# Patient Record
Sex: Female | Born: 1960
Health system: Southern US, Community
[De-identification: ages and names within clinical notes are randomized; demographics above are authoritative.]

## PROBLEM LIST (undated history)

## (undated) DIAGNOSIS — K759 Inflammatory liver disease, unspecified: Secondary | ICD-10-CM

## (undated) DIAGNOSIS — G43909 Migraine, unspecified, not intractable, without status migrainosus: Secondary | ICD-10-CM

## (undated) DIAGNOSIS — J309 Allergic rhinitis, unspecified: Secondary | ICD-10-CM

## (undated) DIAGNOSIS — M1990 Unspecified osteoarthritis, unspecified site: Secondary | ICD-10-CM

## (undated) DIAGNOSIS — N83209 Unspecified ovarian cyst, unspecified side: Secondary | ICD-10-CM

## (undated) DIAGNOSIS — D72819 Decreased white blood cell count, unspecified: Secondary | ICD-10-CM

## (undated) HISTORY — PX: KNEE ARTHROSCOPY: SHX127

## (undated) HISTORY — DX: Decreased white blood cell count, unspecified: D72.819

## (undated) HISTORY — DX: Allergic rhinitis, unspecified: J30.9

## (undated) HISTORY — PX: COLONOSCOPY: SHX174

## (undated) HISTORY — PX: HAMMER TOE SURGERY: SHX385

## (undated) HISTORY — DX: Migraine, unspecified, not intractable, without status migrainosus: G43.909

## (undated) HISTORY — PX: TONSILLECTOMY: SUR1361

---

## 1998-06-03 ENCOUNTER — Other Ambulatory Visit: Admission: RE | Admit: 1998-06-03 | Discharge: 1998-06-03 | Payer: Self-pay

## 1998-12-03 ENCOUNTER — Other Ambulatory Visit: Admission: RE | Admit: 1998-12-03 | Discharge: 1998-12-03 | Payer: Self-pay | Admitting: *Deleted

## 1999-06-07 ENCOUNTER — Other Ambulatory Visit: Admission: RE | Admit: 1999-06-07 | Discharge: 1999-06-07 | Payer: Self-pay | Admitting: Obstetrics and Gynecology

## 2000-02-06 ENCOUNTER — Ambulatory Visit (HOSPITAL_COMMUNITY): Admission: RE | Admit: 2000-02-06 | Discharge: 2000-02-06 | Payer: Self-pay | Admitting: Obstetrics and Gynecology

## 2000-02-06 ENCOUNTER — Encounter: Payer: Self-pay | Admitting: Obstetrics and Gynecology

## 2000-04-28 ENCOUNTER — Inpatient Hospital Stay (HOSPITAL_COMMUNITY): Admission: RE | Admit: 2000-04-28 | Discharge: 2000-04-28 | Payer: Self-pay | Admitting: Obstetrics and Gynecology

## 2000-10-12 ENCOUNTER — Other Ambulatory Visit: Admission: RE | Admit: 2000-10-12 | Discharge: 2000-10-12 | Payer: Self-pay | Admitting: Internal Medicine

## 2001-08-23 ENCOUNTER — Other Ambulatory Visit: Admission: RE | Admit: 2001-08-23 | Discharge: 2001-08-23 | Payer: Self-pay | Admitting: Internal Medicine

## 2002-06-16 ENCOUNTER — Other Ambulatory Visit: Admission: RE | Admit: 2002-06-16 | Discharge: 2002-06-16 | Payer: Self-pay | Admitting: Obstetrics and Gynecology

## 2003-02-17 ENCOUNTER — Encounter: Payer: Self-pay | Admitting: Internal Medicine

## 2003-06-17 ENCOUNTER — Other Ambulatory Visit: Admission: RE | Admit: 2003-06-17 | Discharge: 2003-06-17 | Payer: Self-pay | Admitting: Obstetrics and Gynecology

## 2004-07-05 ENCOUNTER — Other Ambulatory Visit: Admission: RE | Admit: 2004-07-05 | Discharge: 2004-07-05 | Payer: Self-pay | Admitting: Obstetrics and Gynecology

## 2005-07-12 ENCOUNTER — Other Ambulatory Visit: Admission: RE | Admit: 2005-07-12 | Discharge: 2005-07-12 | Payer: Self-pay | Admitting: Obstetrics and Gynecology

## 2005-12-18 ENCOUNTER — Encounter: Payer: Self-pay | Admitting: Internal Medicine

## 2006-05-11 ENCOUNTER — Ambulatory Visit: Payer: Self-pay | Admitting: Internal Medicine

## 2006-05-30 ENCOUNTER — Ambulatory Visit: Payer: Self-pay | Admitting: Internal Medicine

## 2006-07-03 ENCOUNTER — Ambulatory Visit: Payer: Self-pay | Admitting: Internal Medicine

## 2006-07-03 LAB — CONVERTED CEMR LAB
ALT: 279 units/L — ABNORMAL HIGH (ref 0–40)
AST: 147 units/L — ABNORMAL HIGH (ref 0–37)
Albumin: 3.8 g/dL (ref 3.5–5.2)
Alkaline Phosphatase: 68 units/L (ref 39–117)
BUN: 5 mg/dL — ABNORMAL LOW (ref 6–23)
Basophils Absolute: 0 10*3/uL (ref 0.0–0.1)
Basophils Relative: 0.3 % (ref 0.0–1.0)
Bilirubin, Direct: 0.1 mg/dL (ref 0.0–0.3)
CO2: 30 meq/L (ref 19–32)
Calcium: 9.2 mg/dL (ref 8.4–10.5)
Chloride: 106 meq/L (ref 96–112)
Cholesterol: 161 mg/dL (ref 0–200)
Creatinine, Ser: 0.8 mg/dL (ref 0.4–1.2)
Eosinophils Absolute: 0.1 10*3/uL (ref 0.0–0.6)
Eosinophils Relative: 2.3 % (ref 0.0–5.0)
GFR calc Af Amer: 98 mL/min
GFR calc non Af Amer: 81 mL/min
Glucose, Bld: 93 mg/dL (ref 70–99)
HCT: 37 % (ref 36.0–46.0)
HDL: 63.6 mg/dL (ref >39.0)
Hemoglobin: 12.5 g/dL (ref 12.0–15.0)
LDL Cholesterol: 89 mg/dL (ref 0–99)
Lymphocytes Relative: 31.7 % (ref 12.0–46.0)
MCHC: 33.8 g/dL (ref 30.0–36.0)
MCV: 80.8 fL (ref 78.0–100.0)
Monocytes Absolute: 0.4 10*3/uL (ref 0.2–0.7)
Monocytes Relative: 11.7 % — ABNORMAL HIGH (ref 3.0–11.0)
Neutro Abs: 1.9 10*3/uL (ref 1.4–7.7)
Neutrophils Relative %: 54 % (ref 43.0–77.0)
Platelets: 188 10*3/uL (ref 150–400)
Potassium: 3.8 meq/L (ref 3.5–5.1)
RBC: 4.58 M/uL (ref 3.87–5.11)
RDW: 13.6 % (ref 11.5–14.6)
Sodium: 140 meq/L (ref 135–145)
TSH: 0.88 microintl units/mL (ref 0.35–5.50)
Total Bilirubin: 1.2 mg/dL (ref 0.3–1.2)
Total CHOL/HDL Ratio: 2.5
Total Protein: 6.5 g/dL (ref 6.0–8.3)
Triglycerides: 40 mg/dL (ref 0–149)
VLDL: 8 mg/dL (ref 0–40)
WBC: 3.5 10*3/uL — ABNORMAL LOW (ref 4.5–10.5)

## 2006-07-10 ENCOUNTER — Ambulatory Visit: Payer: Self-pay | Admitting: Internal Medicine

## 2006-07-10 LAB — CONVERTED CEMR LAB
ALT: 220 units/L — ABNORMAL HIGH (ref 0–40)
AST: 120 units/L — ABNORMAL HIGH (ref 0–37)
Albumin: 4.2 g/dL (ref 3.5–5.2)
Alkaline Phosphatase: 73 units/L (ref 39–117)
Anti Nuclear Antibody(ANA): NEGATIVE
Bilirubin, Direct: 0.2 mg/dL (ref 0.0–0.3)
Ceruloplasmin: 52 mg/dL (ref 21–63)
Ferritin: 105.9 ng/mL (ref 10.0–291.0)
HCV Ab: NEGATIVE
Hep A IgM: NEGATIVE
Hep B C IgM: NEGATIVE
Hepatitis B Surface Ag: NEGATIVE
Total Bilirubin: 1.1 mg/dL (ref 0.3–1.2)
Total Protein: 7 g/dL (ref 6.0–8.3)

## 2006-07-17 ENCOUNTER — Encounter: Admission: RE | Admit: 2006-07-17 | Discharge: 2006-07-17 | Payer: Self-pay | Admitting: Internal Medicine

## 2006-08-16 ENCOUNTER — Ambulatory Visit: Payer: Self-pay | Admitting: Internal Medicine

## 2006-08-16 LAB — CONVERTED CEMR LAB
Cholesterol: 197 mg/dL (ref 0–200)
HDL: 65 mg/dL (ref >39.0)
LDL Cholesterol: 127 mg/dL — ABNORMAL HIGH (ref 0–99)
Total CHOL/HDL Ratio: 3
Triglycerides: 27 mg/dL (ref 0–149)
VLDL: 5 mg/dL (ref 0–40)

## 2006-09-03 ENCOUNTER — Ambulatory Visit: Payer: Self-pay | Admitting: Internal Medicine

## 2006-09-03 LAB — CONVERTED CEMR LAB
ALT: 33 units/L (ref 0–35)
AST: 32 units/L (ref 0–37)
Albumin: 3.9 g/dL (ref 3.5–5.2)
Alkaline Phosphatase: 67 units/L (ref 39–117)
Bilirubin, Direct: 0.1 mg/dL (ref 0.0–0.3)
Total Bilirubin: 0.8 mg/dL (ref 0.3–1.2)
Total Protein: 6.8 g/dL (ref 6.0–8.3)

## 2006-09-11 ENCOUNTER — Ambulatory Visit: Payer: Self-pay | Admitting: Internal Medicine

## 2006-09-21 ENCOUNTER — Ambulatory Visit: Payer: Self-pay | Admitting: Internal Medicine

## 2006-09-21 LAB — HM COLONOSCOPY: HM Colonoscopy: NORMAL

## 2006-12-18 ENCOUNTER — Encounter: Payer: Self-pay | Admitting: Internal Medicine

## 2008-01-27 ENCOUNTER — Encounter: Payer: Self-pay | Admitting: Internal Medicine

## 2008-09-14 ENCOUNTER — Ambulatory Visit: Payer: Self-pay | Admitting: Internal Medicine

## 2008-09-14 DIAGNOSIS — R519 Headache, unspecified: Secondary | ICD-10-CM | POA: Insufficient documentation

## 2008-09-14 DIAGNOSIS — N951 Menopausal and female climacteric states: Secondary | ICD-10-CM | POA: Insufficient documentation

## 2008-09-14 DIAGNOSIS — R51 Headache: Secondary | ICD-10-CM | POA: Insufficient documentation

## 2008-09-21 ENCOUNTER — Encounter: Payer: Self-pay | Admitting: *Deleted

## 2008-09-21 LAB — CONVERTED CEMR LAB
BUN: 12 mg/dL (ref 6–23)
Basophils Absolute: 0 10*3/uL (ref 0.0–0.1)
Basophils Relative: 0.7 % (ref 0.0–3.0)
CO2: 32 meq/L (ref 19–32)
Calcium: 9.4 mg/dL (ref 8.4–10.5)
Chloride: 103 meq/L (ref 96–112)
Creatinine, Ser: 0.7 mg/dL (ref 0.4–1.2)
Eosinophils Absolute: 0 10*3/uL (ref 0.0–0.7)
Eosinophils Relative: 1.2 % (ref 0.0–5.0)
FSH: 83.3 milliintl units/mL
Folate: >20 ng/mL
Free T4: 1 ng/dL (ref 0.6–1.6)
GFR calc non Af Amer: 113.68 mL/min (ref >60)
Glucose, Bld: 98 mg/dL (ref 70–99)
HCT: 37.7 % (ref 36.0–46.0)
Hemoglobin: 12.8 g/dL (ref 12.0–15.0)
Lymphocytes Relative: 41.1 % (ref 12.0–46.0)
Lymphs Abs: 1.4 10*3/uL (ref 0.7–4.0)
MCHC: 33.9 g/dL (ref 30.0–36.0)
MCV: 81.1 fL (ref 78.0–100.0)
Monocytes Absolute: 0.4 10*3/uL (ref 0.1–1.0)
Monocytes Relative: 10.6 % (ref 3.0–12.0)
Neutro Abs: 1.6 10*3/uL (ref 1.4–7.7)
Neutrophils Relative %: 46.4 % (ref 43.0–77.0)
Platelets: 170 10*3/uL (ref 150.0–400.0)
Potassium: 3.8 meq/L (ref 3.5–5.1)
RBC: 4.64 M/uL (ref 3.87–5.11)
RDW: 12.3 % (ref 11.5–14.6)
Sodium: 140 meq/L (ref 135–145)
T3, Free: 2.4 pg/mL (ref 2.3–4.2)
TSH: 0.42 microintl units/mL (ref 0.35–5.50)
Vitamin B-12: >1500 pg/mL — ABNORMAL HIGH (ref 211–911)
WBC: 3.4 10*3/uL — ABNORMAL LOW (ref 4.5–10.5)

## 2008-09-25 ENCOUNTER — Telehealth: Payer: Self-pay | Admitting: *Deleted

## 2008-12-03 IMAGING — US UNKNOWN
1 series · 14 of 16 positions shown · non-contrast
Comparison: None.

ABDOMEN ULTRASOUND:

CLINICAL DATA: Elevated LFTs. Abdominal pain.
TECHNIQUE: Complete abdominal ultrasound examination was performed including
evaluation of the liver, gallbladder, bile ducts, pancreas, kidneys, spleen,
IVC, and abdominal aorta.

[Series 1: unknown · 0.30mm/px · 14 of 65 slices shown]
[im 1/65]
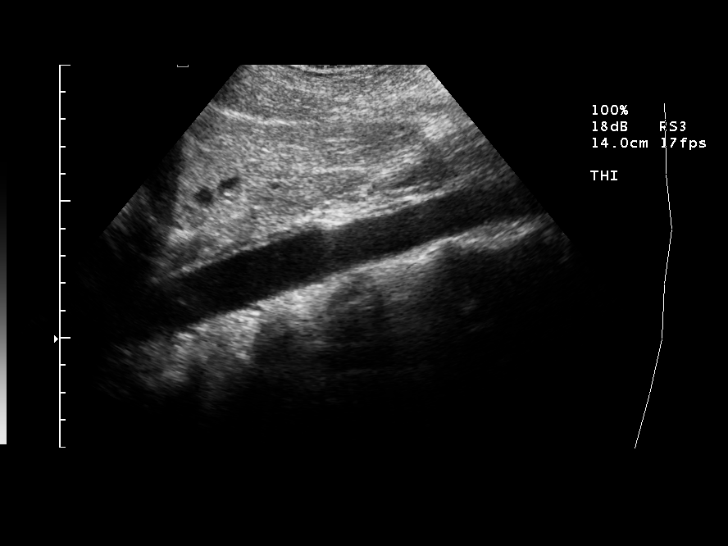
[im 5/65]
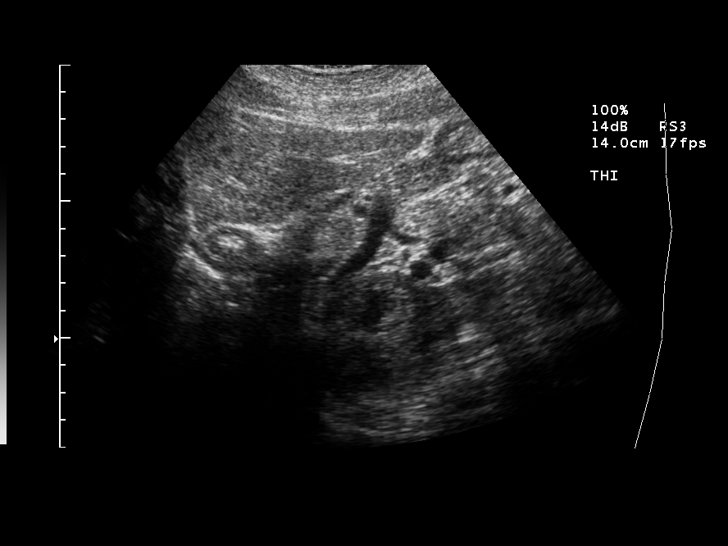
[im 9/65]
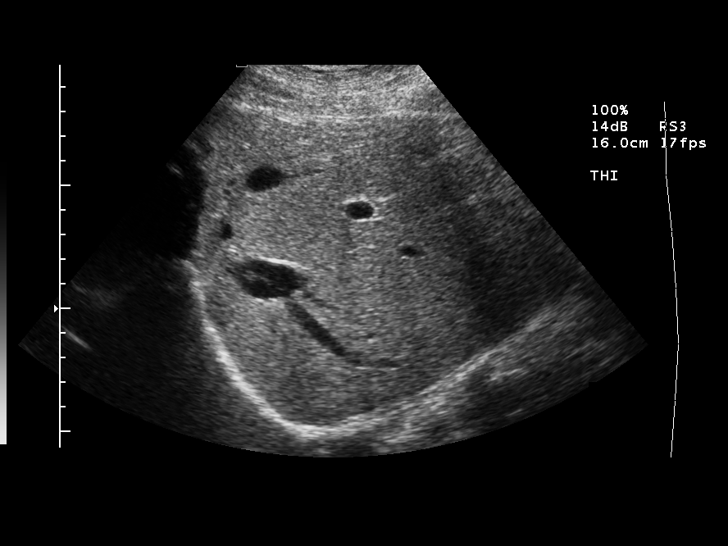
[im 18/65]
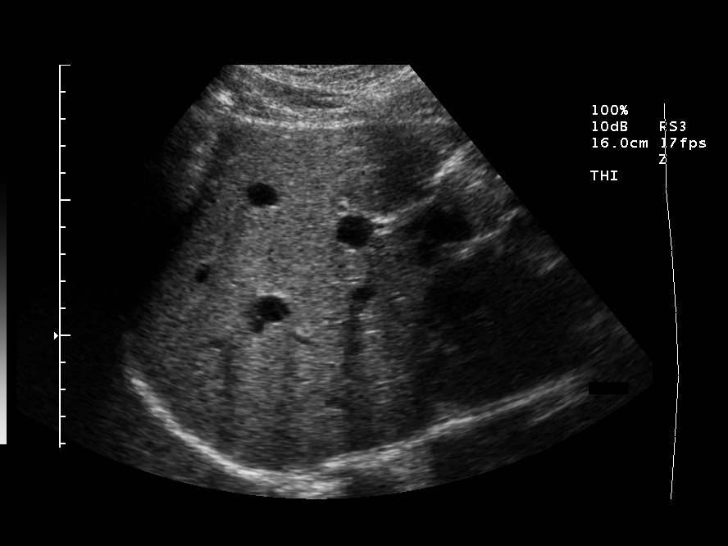
[im 22/65]
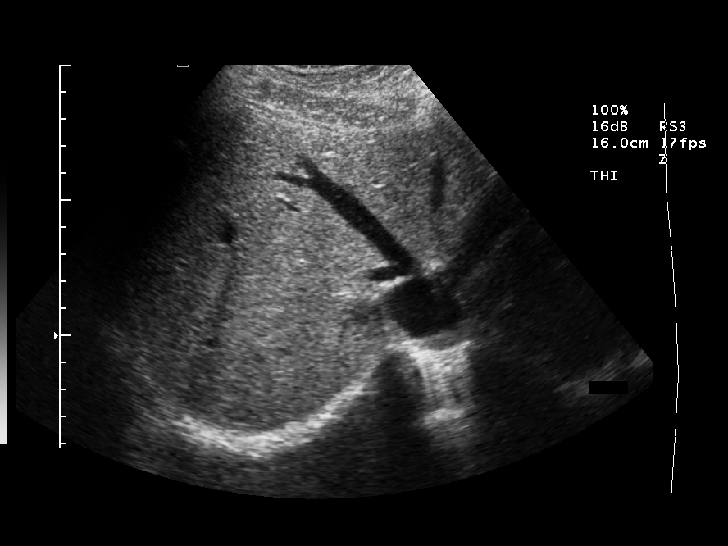
[im 26/65]
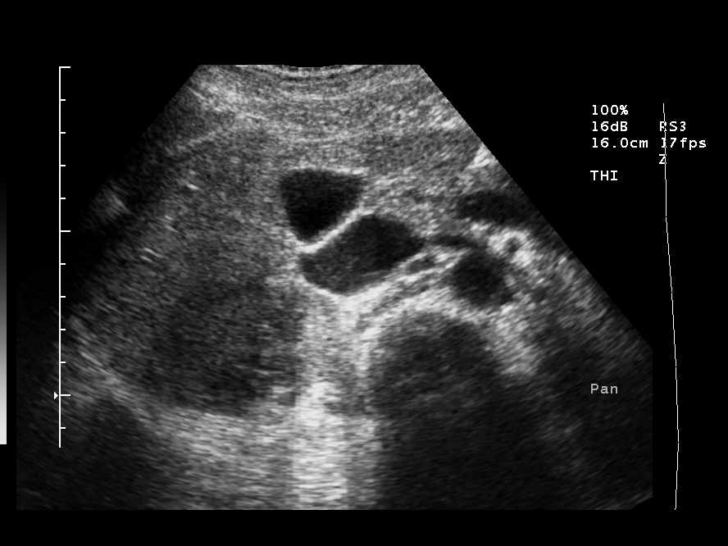
[im 30/65]
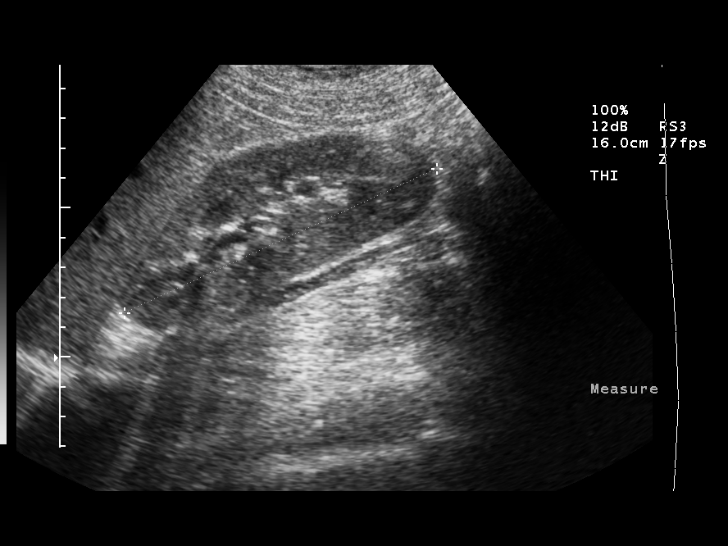
[im 35/65]
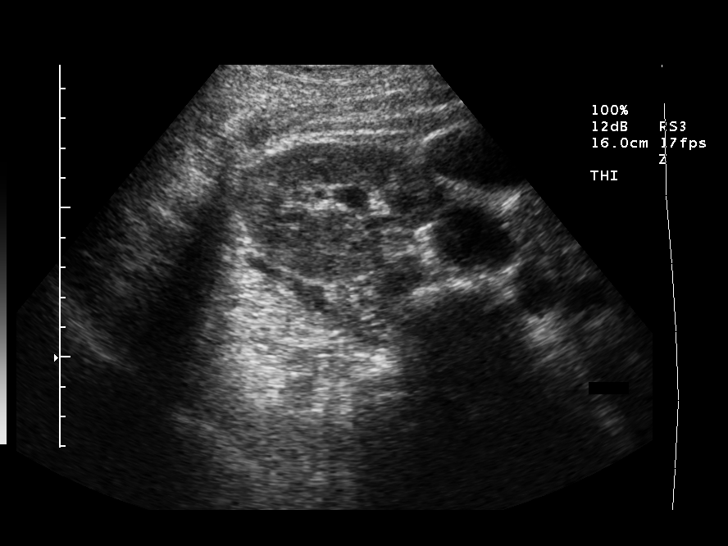
[im 39/65]
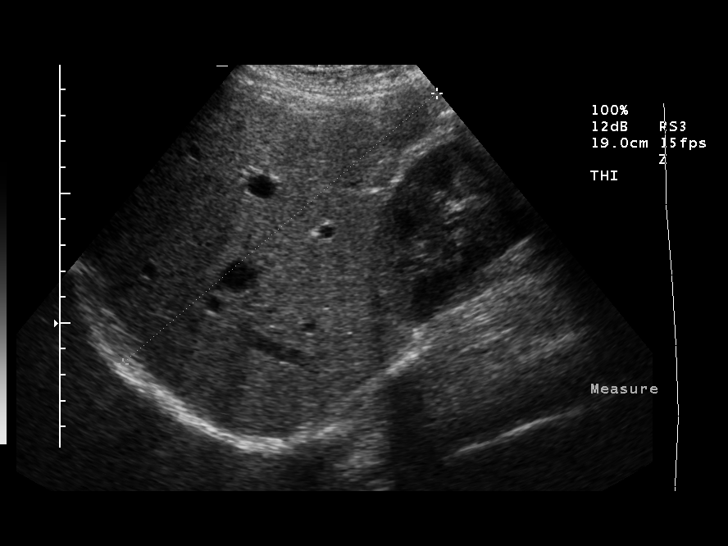
[im 43/65]
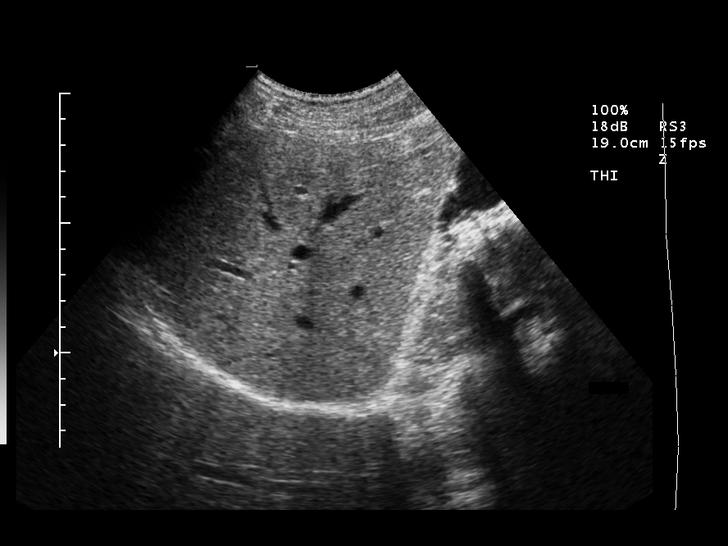
[im 52/65]
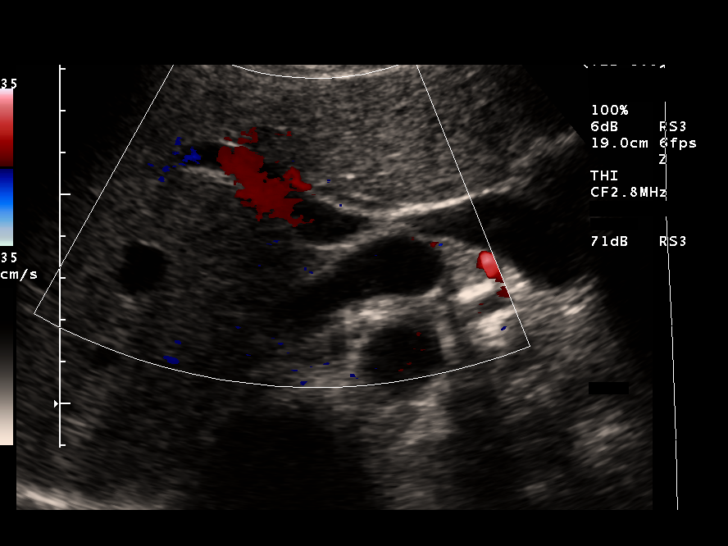
[im 56/65]
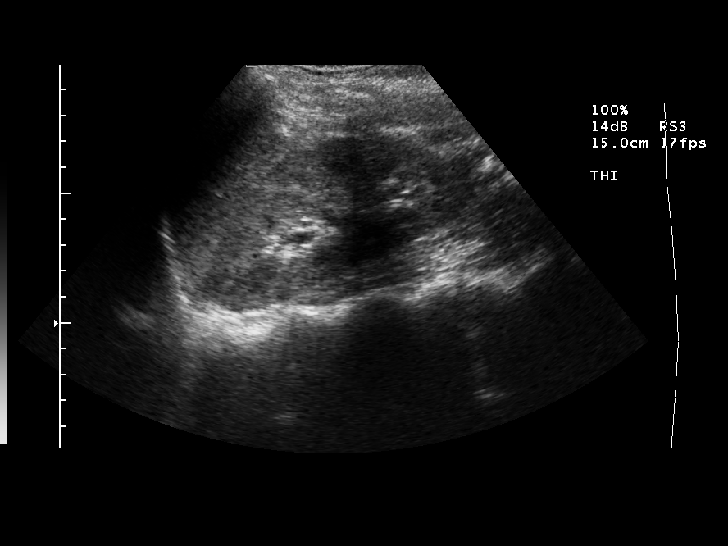
[im 60/65]
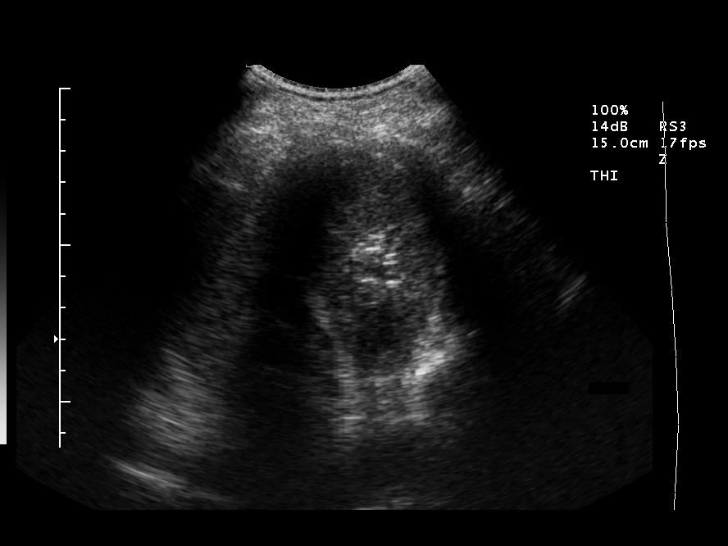
[im 65/65]
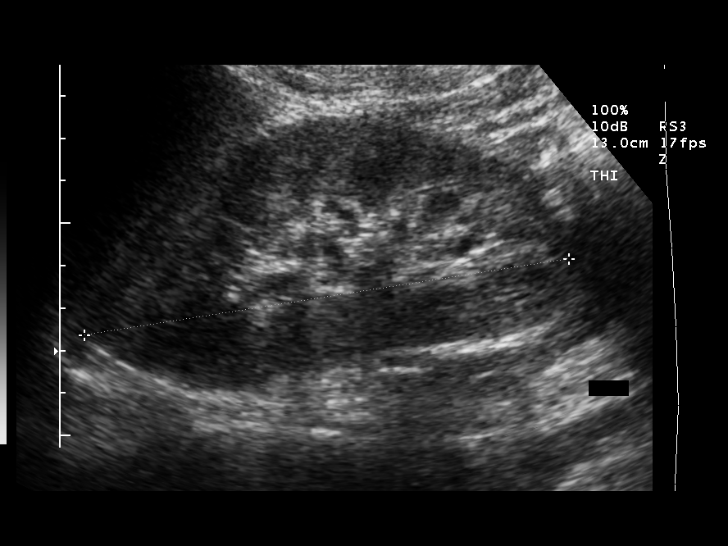

[14 of 16 positions shown; findings below may reference images not displayed]

FINDINGS: Gallbladder: Normal. Specifically, there is no evidence for gallstones,
gallbladder wall thickening or pericholecystic fluid.

Common Bile Duct:  Nondilated

Liver:  Normal

Inferior Vena Cava:  Normal

Pancreas:  Normal

Spleen:  Normal

Right Kidney:  11.5 cm in long axis.  Normal

Left Kidney:  11.6 cm in long axis.  Normal

Aorta:  No aneurysm
IMPRESSION: Normal abdominal ultrasound.

## 2009-01-07 ENCOUNTER — Encounter (INDEPENDENT_AMBULATORY_CARE_PROVIDER_SITE_OTHER): Payer: Self-pay | Admitting: *Deleted

## 2009-01-19 ENCOUNTER — Ambulatory Visit: Payer: Self-pay | Admitting: Internal Medicine

## 2009-06-01 ENCOUNTER — Ambulatory Visit: Payer: Self-pay | Admitting: Internal Medicine

## 2009-06-01 LAB — CONVERTED CEMR LAB
ALT: 19 units/L (ref 0–35)
AST: 23 units/L (ref 0–37)
Albumin: 3.9 g/dL (ref 3.5–5.2)
Alkaline Phosphatase: 53 units/L (ref 39–117)
BUN: 11 mg/dL (ref 6–23)
Basophils Absolute: 0 10*3/uL (ref 0.0–0.1)
Basophils Relative: 0.9 % (ref 0.0–3.0)
Bilirubin Urine: NEGATIVE
Bilirubin, Direct: 0.1 mg/dL (ref 0.0–0.3)
Blood in Urine, dipstick: NEGATIVE
CO2: 29 meq/L (ref 19–32)
Calcium: 9 mg/dL (ref 8.4–10.5)
Chloride: 103 meq/L (ref 96–112)
Cholesterol: 199 mg/dL (ref 0–200)
Creatinine, Ser: 0.9 mg/dL (ref 0.4–1.2)
Eosinophils Absolute: 0.1 10*3/uL (ref 0.0–0.7)
Eosinophils Relative: 1.8 % (ref 0.0–5.0)
GFR calc non Af Amer: 84.82 mL/min (ref >60)
Glucose, Bld: 84 mg/dL (ref 70–99)
Glucose, Urine, Semiquant: NEGATIVE
HCT: 36.2 % (ref 36.0–46.0)
HDL: 82.6 mg/dL (ref >39.00)
Hemoglobin: 12.2 g/dL (ref 12.0–15.0)
Ketones, urine, test strip: NEGATIVE
LDL Cholesterol: 113 mg/dL — ABNORMAL HIGH (ref 0–99)
Lymphocytes Relative: 45.2 % (ref 12.0–46.0)
Lymphs Abs: 1.3 10*3/uL (ref 0.7–4.0)
MCHC: 33.8 g/dL (ref 30.0–36.0)
MCV: 81.8 fL (ref 78.0–100.0)
Monocytes Absolute: 0.3 10*3/uL (ref 0.1–1.0)
Monocytes Relative: 10.4 % (ref 3.0–12.0)
Neutro Abs: 1.2 10*3/uL — ABNORMAL LOW (ref 1.4–7.7)
Neutrophils Relative %: 41.7 % — ABNORMAL LOW (ref 43.0–77.0)
Nitrite: NEGATIVE
Platelets: 172 10*3/uL (ref 150.0–400.0)
Potassium: 3.6 meq/L (ref 3.5–5.1)
Protein, U semiquant: NEGATIVE
RBC: 4.42 M/uL (ref 3.87–5.11)
RDW: 13.7 % (ref 11.5–14.6)
Sodium: 139 meq/L (ref 135–145)
Specific Gravity, Urine: 1.01
TSH: 0.79 microintl units/mL (ref 0.35–5.50)
Total Bilirubin: 0.6 mg/dL (ref 0.3–1.2)
Total CHOL/HDL Ratio: 2
Total Protein: 6.9 g/dL (ref 6.0–8.3)
Triglycerides: 16 mg/dL (ref 0.0–149.0)
Urobilinogen, UA: 0.2
VLDL: 3.2 mg/dL (ref 0.0–40.0)
WBC Urine, dipstick: NEGATIVE
WBC: 2.9 10*3/uL — ABNORMAL LOW (ref 4.5–10.5)
pH: 7.5

## 2009-06-08 ENCOUNTER — Other Ambulatory Visit: Admission: RE | Admit: 2009-06-08 | Discharge: 2009-06-08 | Payer: Self-pay | Admitting: Internal Medicine

## 2009-06-08 ENCOUNTER — Ambulatory Visit: Payer: Self-pay | Admitting: Internal Medicine

## 2009-06-08 ENCOUNTER — Telehealth: Payer: Self-pay | Admitting: *Deleted

## 2009-06-08 DIAGNOSIS — D72819 Decreased white blood cell count, unspecified: Secondary | ICD-10-CM | POA: Insufficient documentation

## 2009-06-09 ENCOUNTER — Ambulatory Visit: Payer: Self-pay | Admitting: Oncology

## 2009-06-14 LAB — CONVERTED CEMR LAB: Pap Smear: NEGATIVE

## 2009-06-30 ENCOUNTER — Encounter: Payer: Self-pay | Admitting: Internal Medicine

## 2009-06-30 LAB — CHCC SMEAR

## 2009-06-30 LAB — CBC WITH DIFFERENTIAL/PLATELET
BASO%: 0.5 % (ref 0.0–2.0)
HCT: 39.8 % (ref 34.8–46.6)
MCHC: 32.9 g/dL (ref 31.5–36.0)
MONO#: 0.4 10*3/uL (ref 0.1–0.9)
RBC: 4.91 10*6/uL (ref 3.70–5.45)
RDW: 13.1 % (ref 11.2–14.5)
WBC: 4 10*3/uL (ref 3.9–10.3)
lymph#: 1.3 10*3/uL (ref 0.9–3.3)
nRBC: 0 % (ref 0–0)

## 2009-06-30 LAB — COMPREHENSIVE METABOLIC PANEL
ALT: 14 U/L (ref 0–35)
AST: 19 U/L (ref 0–37)
Albumin: 4.8 g/dL (ref 3.5–5.2)
Calcium: 9.7 mg/dL (ref 8.4–10.5)
Chloride: 101 mEq/L (ref 96–112)
Potassium: 4.1 mEq/L (ref 3.5–5.3)
Sodium: 139 mEq/L (ref 135–145)

## 2009-08-24 ENCOUNTER — Ambulatory Visit: Payer: Self-pay | Admitting: Family Medicine

## 2009-08-24 DIAGNOSIS — E291 Testicular hypofunction: Secondary | ICD-10-CM | POA: Insufficient documentation

## 2009-08-25 ENCOUNTER — Telehealth: Payer: Self-pay | Admitting: Family Medicine

## 2009-08-25 LAB — CONVERTED CEMR LAB
Albumin: 4.2 g/dL (ref 3.5–5.2)
BUN: 16 mg/dL (ref 6–23)
Basophils Absolute: 0 10*3/uL (ref 0.0–0.1)
Bilirubin, Direct: 0.1 mg/dL (ref 0.0–0.3)
Creatinine, Ser: 0.7 mg/dL (ref 0.4–1.2)
Eosinophils Absolute: 0.1 10*3/uL (ref 0.0–0.7)
GFR calc non Af Amer: 106.22 mL/min (ref >60)
Glucose, Bld: 84 mg/dL (ref 70–99)
Lymphocytes Relative: 34.3 % (ref 12.0–46.0)
MCHC: 33.9 g/dL (ref 30.0–36.0)
Monocytes Relative: 8.6 % (ref 3.0–12.0)
Neutro Abs: 2.2 10*3/uL (ref 1.4–7.7)
Neutrophils Relative %: 55.2 % (ref 43.0–77.0)
Phosphorus: 3.9 mg/dL (ref 2.3–4.6)
Platelets: 177 10*3/uL (ref 150.0–400.0)
RDW: 13.7 % (ref 11.5–14.6)
Sed Rate: 8 mm/hr (ref 0–22)
Total Bilirubin: 0.5 mg/dL (ref 0.3–1.2)
Total Protein: 7 g/dL (ref 6.0–8.3)

## 2010-02-01 ENCOUNTER — Encounter: Payer: Self-pay | Admitting: Internal Medicine

## 2010-02-09 ENCOUNTER — Encounter: Payer: Self-pay | Admitting: Internal Medicine

## 2010-03-09 ENCOUNTER — Encounter: Payer: Self-pay | Admitting: Internal Medicine

## 2010-03-25 ENCOUNTER — Telehealth: Payer: Self-pay | Admitting: *Deleted

## 2010-03-29 ENCOUNTER — Encounter: Payer: Self-pay | Admitting: Internal Medicine

## 2010-03-29 NOTE — Progress Notes (Signed)
Summary: FU  Phone Note Call from Patient   Caller: Patient Call For: Reuel Derby, MD Summary of Call: Please call pt at this number. [phone] Initial call taken by: Lynann Beaver CMA,  August 25, 2009 11:30 AM  Follow-up for Phone Call        Pt was called & is aware of labs Follow-up by: Kathrynn Speed CMA,  August 25, 2009 2:58 PM

## 2010-03-29 NOTE — Assessment & Plan Note (Signed)
Summary: CPX // RS   Vital Signs:  Patient profile:   50 year old female Menstrual status:  postmenopausal Height:      65 inches Weight:      152 pounds Pulse rate:   72 / minute BP sitting:   100 / 60  (left arm) Cuff size:   regular  Vitals Entered By: Romualdo Bolk, CMA Duncan Dull) (June 08, 2009 9:19 AM) CC: CPX with pap   History of Present Illness: Erica Reilly comes in  for preventive visit  Since last visit  here  there have been no major changes in health status  . Due for PAP last done by gyne a couple of years ago.  Due for TDap. No sig infections or change in health since last visit.    Preventive Care Screening  Mammogram:    Date:  11/27/2008    Results:  normal   Colonoscopy:    Date:  09/21/2006    Results:  normal    Preventive Screening-Counseling & Management  Alcohol-Tobacco     Alcohol drinks/day: <1     Smoking Status: never  Caffeine-Diet-Exercise     Caffeine use/day: 1-2     Does Patient Exercise: yes     Type of exercise: aerobics and wts     Times/week: 4-6  Hep-HIV-STD-Contraception     Dental Visit-last 6 months yes  Safety-Violence-Falls     Seat Belt Use: yes     Firearms in the Home: no firearms in the home     Smoke Detectors: yes      Blood Transfusions:  no.    Current Medications (verified): 1)  Estraderm 0.1 Mg/24hr Pttw (Estradiol) .... Apply  To Skin   and Change in 4 Days To Use 2 X Per Week. 2)  Medroxyprogesterone Acetate 2.5 Mg Tabs (Medroxyprogesterone Acetate) .Marland Kitchen.. 1 By Mouth Once Daily  Allergies (verified): No Known Drug Allergies  Past History:  Past medical, surgical, family and social histories (including risk factors) reviewed, and no changes noted (except as noted below).  Past Medical History: Headache G3P0 Alopecia Chest pain  atypicaleval   Nahsher 2004 neg echo and stress test  Past Surgical History: Reviewed history from 09/14/2008 and no changes required. Denies surgical  history  Family History: Reviewed history from 09/14/2008 and no changes required. Father: Colon Ca and brain Tumor Mother: DM, arthriisis, HBP Siblings: 4 Brothers and 2 Sisters- 1 Sister-HBP All has joint issues 1 Brother- Heart murmur  Social History: Reviewed history from 09/14/2008 and no changes required. Married Never Smoked Alcohol use-no Drug use-no HH of  2  cat died .  Seat Belt Use:  yes Dental Care w/in 6 mos.:  yes Blood Transfusions:  no  Review of Systems  The patient denies anorexia, fever, weight loss, weight gain, vision loss, decreased hearing, hoarseness, chest pain, syncope, dyspnea on exertion, peripheral edema, prolonged cough, headaches, hemoptysis, abdominal pain, melena, hematochezia, severe indigestion/heartburn, hematuria, incontinence, muscle weakness, transient blindness, difficulty walking, depression, unusual weight change, abnormal bleeding, enlarged lymph nodes, angioedema, and breast masses.         age realated vision changes  no unusual infections    No systemic joint problems but does have djd in the knees  Physical Exam General Appearance: well developed, well nourished, no acute distress Eyes: conjunctiva and lids normal, PERRLA, EOMI, WNL Ears, Nose, Mouth, Throat: TM clear, nares clear, oral exam WNL Neck: supple, no lymphadenopathy, no thyromegaly, no JVD Respiratory: clear to auscultation and  percussion, respiratory effort normal Cardiovascular: regular rate and rhythm, S1-S2, no murmur, rub or gallop, no bruits, peripheral pulses normal and symmetric, no cyanosis, clubbing, edema or varicosities Chest: no scars, masses, tenderness; no asymmetry, skin changes, nipple discharge   Gastrointestinal: soft, non-tender; no hepatosplenomegaly, masses; active bowel sounds all quadrants, guaiac negative stool; no masses, tenderness, hemorrhoids  Genitourinary: no vaginal discharge, lesions; no masses or tenderness  cx os clear PAP done   posterior uterus  Lymphatic: no cervical, axillary or inguinal adenopathy Musculoskeletal: gait normal, muscle tone and strength WNL, no joint swelling, effusions, discoloration, crepitus  Skin: clear, good turgor, color WNL, no rashes, lesions, or ulcerations Neurologic: normal mental status, normal reflexes, normal strength, sensation, and motion Psychiatric: alert; oriented to person, place and time Other Exam:  EKG   SR borderline pr  .212 . rate 51  low r wave V2 3     Impression & Recommendations:  Problem # 1:  HEALTH MAINTENANCE EXAM, ADULT (ICD-V70.0)  Discussed nutrition,exercise,diet,healthy weight, vitamin D and calcium.    continue healthy diet   Orders: EKG w/ Interpretation (93000)  Problem # 2:  ROUTINE GYNECOLOGICAL EXAM (ICD-V72.31)  pap done  Orders: Pap Smear, Thin Prep ( Collection of) (Z6109)  Problem # 3:  LEUKOPENIA, CHRONIC (ICD-288.50) reviewed of records show continued lower wbc without obvious cause  no documented med rheumatic disease of infection  but now her wbc is 2.9   disc consultation Orders: Hematology Referral (Hematology)  Complete Medication List: 1)  Estraderm 0.1 Mg/24hr Pttw (Estradiol) .... Apply  to skin   and change in 4 days to use 2 x per week. 2)  Medroxyprogesterone Acetate 2.5 Mg Tabs (Medroxyprogesterone acetate) .Marland Kitchen.. 1 by mouth once daily  Other Orders: Tdap => 73yrs IM (60454) Admin 1st Vaccine (09811)  Patient Instructions: 1)  you will be contacted about  hematology consult about your low White blood count.  2)  i will review  your record  to check for other EKGs . but no alarm features to your EKG.  3)  Eat heart healthy diet . mediterranean type  4)  maintain healthy body weight and exercise. 5)  recheck in 1 year  or as needed.   Immunizations Administered:  Tetanus Vaccine:    Vaccine Type: Tdap    Site: right deltoid    Mfr: Sanofi Pasteur    Dose: 0.5 ml    Route: IM    Given by: Romualdo Bolk, CMA  (AAMA)    Exp. Date: 09/25/2010    Lot #: [id number]   Appended Document: CPX // RS add hx of  paper record  review.    Had focal scarring alopecia  treated by derm with injectable steroids   in 2007 .  WBC was at 3 in the rmote past .  No hx of rheumatic disease.

## 2010-03-29 NOTE — Assessment & Plan Note (Signed)
Summary: severe ha/njr   Vital Signs:  Patient profile:   50 year old female Menstrual status:  postmenopausal Weight:      153 pounds (69.55 kg) O2 Sat:      99 % on Room air Temp:     97.9 degrees F (36.61 degrees C) oral Pulse rate:   67 / minute BP sitting:   110 / 80  (left arm) Cuff size:   regular  Vitals Entered By: Kathrynn Speed CMA (August 24, 2009 12:42 PM)  O2 Flow:  Room air CC: Severe headache for 2 hrs / src, Back Pain   History of Present Illness: Patient in today for evaluation of a HA that will not resolve, it started yesterday midday. She was at work and it came on across her forehead. No associated symptoms. She took 3 OTC Ibuprofen and continued working, waited about 4 hours and took 3 more Ibuprofen last night. She slept normally overnight but when she woke up the HA was still present. It had not worsened and is not debilitating but would not resolve, so after being at work for [employer] this am she came over to be evaluated. She has a history of migraine HA dating back to age 63yo. When she was younger she had them weekly at times, then as she has aged they have decreased significantly in frequency and intensity. She presently gets only 1-2 per year. She has photophobia/phonophobia/nausea with them. She takes a Midrin and they are generally gone in a few hours. She has not taken a Midrin thus far since she did not think it was a Migraine.   She generally gets 6-8 hours a sleep, she maintains adequate fluid intake, drinking in excess of 60 oz of water daily. She did go on a short vacation over the weekend with her husband and ate more dairy an rich foods than usual. She drank no alcohol and increased her caffeine intake only slighlty. She normally drinks roughly 12 oz of caffeine/day and maybe drank 20 oz/day over the weekend. Had her nl amt today and yesterday. She denies any acute illness/URI or GI symptoms. No CP/palp/SOB/increased stress.  She is struggling with fatigue  and low libido which is ongoing and her OB/GYN started her on compounded testosterone topically once daily just last week. She was already on estrogen and progesterone.   Current Medications (verified): 1)  Estraderm 0.1 Mg/24hr Pttw (Estradiol) .... Apply  To Skin   and Change in 4 Days To Use 2 X Per Week. 2)  Medroxyprogesterone Acetate 2.5 Mg Tabs (Medroxyprogesterone Acetate) .Marland Kitchen.. 1 By Mouth Once Daily  Allergies (verified): No Known Drug Allergies  Comments:  Nurse/Medical Assistant: The patient's medications were reviewed with the patient and were updated in the Medication List. Kathrynn Speed CMA (August 24, 2009 12:46 PM)  Past History:  Past medical history reviewed for relevance to current acute and chronic problems. Social history (including risk factors) reviewed for relevance to current acute and chronic problems.  Past Medical History: Reviewed history from 06/08/2009 and no changes required. Headache G3P0 Alopecia Chest pain  atypicaleval   Nahsher 2004 neg echo and stress test  Social History: Reviewed history from 06/08/2009 and no changes required. Married Never Smoked Alcohol use-no Drug use-no HH of  2  cat died .   Review of Systems      See HPI  Physical Exam  General:  Well-developed,well-nourished,in no acute distress; alert,appropriate and cooperative throughout examination Head:  Normocephalic and atraumatic without obvious abnormalities.  Eyes:  No corneal or conjunctival inflammation noted. EOMI. Perrla. Funduscopic exam benign, without hemorrhages, exudates or papilledema. Vision grossly normal. Ears:  External ear exam shows no significant lesions or deformities.  Otoscopic examination reveals clear canals, tympanic membranes are intact bilaterally without bulging, retraction, inflammation or discharge. Hearing is grossly normal bilaterally. Nose:  External nasal examination shows no deformity or inflammation. Nasal mucosa are pink and moist  without lesions or exudates. Mouth:  Oral mucosa and oropharynx without lesions or exudates.  Teeth in good repair. Neck:  No deformities, masses, or tenderness noted. Lungs:  Normal respiratory effort, chest expands symmetrically. Lungs are clear to auscultation, no crackles or wheezes. Heart:  Normal rate and regular rhythm. S1 and S2 normal without gallop, murmur, click, rub or other extra sounds. Abdomen:  Bowel sounds positive,abdomen soft and non-tender without masses, organomegaly or hernias noted. Extremities:  No clubbing, cyanosis, edema Neurologic:  No cranial nerve deficits noted. Station and gait are normal. Plantar reflexes are down-going bilaterally. DTRs are symmetrical throughout. Sensory, motor and coordinative functions appear intact. Cervical Nodes:  No lymphadenopathy noted Psych:  Cognition and judgment appear intact. Alert and cooperative with normal attention span and concentration. No apparent delusions, illusions, hallucinations   Impression & Recommendations:  Problem # 1:  TESTICULAR HYPOFUNCTION (ICD-257.2)  Orders: TLB-Renal Function Panel (80069-RENAL) TLB-CBC Platelet - w/Differential (85025-CBCD) TLB-Sedimentation Rate (ESR) (85652-ESR) TLB-Hepatic/Liver Function Pnl (80076-HEPATIC) Venipuncture (16109) Started on topical supplement by her OB/GYN, denies any baseline labs done prior to initiation, will draw labs to further evaluate and ask her to stop due to HA  Problem # 2:  HEADACHE (ICD-784.0)  Orders: TLB-Renal Function Panel (80069-RENAL) TLB-CBC Platelet - w/Differential (85025-CBCD) TLB-Sedimentation Rate (ESR) (85652-ESR) Venipuncture (60454) Stop Testosterone, try Midrin as needed, maintain adequate sleep and hydration, if worsening symptoms seek care.  Complete Medication List: 1)  Estraderm 0.1 Mg/24hr Pttw (Estradiol) .... Apply  to skin   and change in 4 days to use 2 x per week. 2)  Medroxyprogesterone Acetate 2.5 Mg Tabs  (Medroxyprogesterone acetate) .Marland Kitchen.. 1 by mouth once daily  Patient Instructions: 1)  Please schedule a follow-up appointment as needed if symptoms or do not resolve. Stop Testosterone. May try Midrin. 2)  Seek immediate care if HA worsens causes any new concerning symptoms such as confusion, weakness, falls, fainting, numbness, photophobia or vomitting

## 2010-03-29 NOTE — Letter (Signed)
Summary: [facility]   Imported By: Maryln Gottron 06/22/2009 09:46:59  _____________________________________________________________________  External Attachment:    Type:   Image     Comment:   External Document

## 2010-03-29 NOTE — Progress Notes (Signed)
Summary: Hormonal Inbalance?  Phone Note Call from Patient   Caller: Patient Summary of Call: Told pt of lab results and she is wanting to know if any blood tests that were ran would indicate a hormonal inbalance? ........Marland KitchenSRC,CMA Initial call taken by: Kathrynn Speed CMA,  August 25, 2009 11:26 AM  Follow-up for Phone Call        No we did not run any hormonal levels. Would have to discuss need for that with PMD if concerns continue Follow-up by: Danise Edge MD,  August 25, 2009 12:22 PM  Additional Follow-up for Phone Call Additional follow up Details #1::        Pt is aware. Additional Follow-up by: Kathrynn Speed CMA,  August 25, 2009 2:43 PM

## 2010-03-29 NOTE — Progress Notes (Signed)
Summary: INFO ONLY  Phone Note Call from Patient   Caller: Patient Summary of Call:  INFO ONLY------Pt adv that she can be reached at [phone] with questions or concerns ref to appt being scheduled for hematology consult.  Initial call taken by: Debbra Riding,  June 08, 2009 2:08 PM  Follow-up for Phone Call        Order sent to [facility]. Follow-up by: Romualdo Bolk, CMA Duncan Dull),  June 08, 2009 2:31 PM

## 2010-03-29 NOTE — Letter (Signed)
[facility]   Imported By: Maryln Gottron 07/16/2009 10:43:19  _____________________________________________________________________  External Attachment:    Type:   Image     Comment:   External Document

## 2010-03-30 ENCOUNTER — Ambulatory Visit (INDEPENDENT_AMBULATORY_CARE_PROVIDER_SITE_OTHER): Payer: Federal, State, Local not specified - PPO | Admitting: Internal Medicine

## 2010-03-30 ENCOUNTER — Encounter: Payer: Self-pay | Admitting: Internal Medicine

## 2010-03-30 VITALS — BP 110/80 | HR 66 | Temp 98.3°F | Wt 154.0 lb

## 2010-03-30 DIAGNOSIS — N951 Menopausal and female climacteric states: Secondary | ICD-10-CM

## 2010-03-30 DIAGNOSIS — R141 Gas pain: Secondary | ICD-10-CM

## 2010-03-30 DIAGNOSIS — J310 Chronic rhinitis: Secondary | ICD-10-CM

## 2010-03-30 DIAGNOSIS — R51 Headache: Secondary | ICD-10-CM

## 2010-03-30 DIAGNOSIS — IMO0001 Reserved for inherently not codable concepts without codable children: Secondary | ICD-10-CM

## 2010-03-30 MED ORDER — FLUTICASONE PROPIONATE 50 MCG/ACT NA SUSP: 2.0000 | Freq: Every day | NASAL | Status: DC

## 2010-03-30 NOTE — Assessment & Plan Note (Signed)
This could be allergic vasomotor or rebound from nose spray. Discussed treatment options with her. She can remain on an antihistamine an oral decongestant for now and add a nasal cortisone. At this point in time she does not appear to have infection. She will followup if not better in 2-3 weeks.

## 2010-03-30 NOTE — Progress Notes (Signed)
  Subjective:    Patient ID: Erica Reilly, female    DOB: [birth year], 50 y.o.   MRN: [id number] Erica Reilly comes in today for followup of her headaches but also is having a problem with her sinuses. She states that for the last 3 weeks she has had significant nasal stuffiness that seemed to begin at about 6:00 at night. No new exposures she has been taking decongestants including Allegra-D and Mucinex D. These are helpful. She's also been using a nose spray recently on Mucinex 12 hour nose spray unsure if it's a decongestant in it she uses it at night to be able to sleep and breathe. She denies significant face pain fever or cold symptoms itching sneezing. She has a remote history of a sinusitis one time. There are no new pets at home or other exposures.   Headaches: She states that these are pretty well controlled and she takes the Midrin for at maximum 3 per headache. We refilled her medicine recently.  Gas: She describes a problem with gas and bloating most recently eats a lot of fiber and her husband thinks it could be from that she denies any constipation change of bowel habits no blood vomiting. She is up-to-date on her colonoscopy and sees a gynecologist. Hot flashes: She is now on a different combination of estrogen and progesterone and is doing much better in this regard. See medicine list HPI    Review of Systems  Constitutional: Negative.   HENT: Positive for congestion and rhinorrhea. Negative for nosebleeds, sore throat, facial swelling, neck pain, dental problem, sinus pressure and tinnitus.   Cardiovascular: Negative.   Neurological: Positive for headaches. Negative for dizziness, tremors, seizures, syncope, facial asymmetry, speech difficulty, weakness, light-headedness and numbness.  Hematological: Negative for adenopathy. Does not bruise/bleed easily.  rest of ros   Negative.     Objective:   Physical Exam  Constitutional: She is oriented to person, place, and time. Vital  signs are normal. She appears well-developed and well-nourished.  HENT:  Head: Normocephalic and atraumatic.  Right Ear: External ear and ear canal normal.  Left Ear: Tympanic membrane, external ear and ear canal normal.  Nose: Mucosal edema (looks like allergic facies     no discharge ) present. No rhinorrhea, sinus tenderness or nasal deformity. Right sinus exhibits no maxillary sinus tenderness. Left sinus exhibits no maxillary sinus tenderness.  Mouth/Throat: Uvula is midline, oropharynx is clear and moist and mucous membranes are normal.  Eyes: Conjunctivae, EOM and lids are normal. Pupils are equal, round, and reactive to light.  Neck: Normal range of motion. Neck supple. No thyromegaly present.  Cardiovascular: Normal rate, regular rhythm, normal heart sounds and intact distal pulses.  Exam reveals no gallop and no friction rub.   No murmur heard. Pulmonary/Chest: Effort normal and breath sounds normal.  Lymphadenopathy:    She has no cervical adenopathy.  Neurological: She is alert and oriented to person, place, and time.  Skin: Skin is warm and dry.          Assessment & Plan:  See assessment an plan.

## 2010-03-30 NOTE — Patient Instructions (Addendum)
Ok to continue allegra D   And we will add  Nasal cortisone spray.     Wean off the decongestant nose spray as discussed .   Call if not better in 2-3 weeks  .Marland Kitchen Could be  Allergic or vasomotor rhinitis. Try elimination   Diet as we discussed about the gas problem . Check yearly about our headaches

## 2010-03-30 NOTE — Assessment & Plan Note (Signed)
Using Midrin as needed only a few times a year about 3 at a time. No increasing progression or alarm findings. Can continue to check yearly.

## 2010-03-31 NOTE — Progress Notes (Signed)
Summary: rx for headaches  Phone Note Call from Patient Call back at Home Phone [phone] Call back at [phone]   Caller: Patient Call For: panosh Summary of Call: wants a rx called in for midrin for her headaches [facility] [address] Initial call taken by: Alfred Levins, CMA,  March 25, 2010 2:38 PM  Follow-up for Phone Call        ok if avialble ok x 1 .  return office visit before next refill if needed Follow-up by: Madelin Headings MD,  March 25, 2010 4:50 PM  Additional Follow-up for Phone Call Additional follow up Details #1::        Spoke to pharmacy and they do have it.  Rx called in. Additional Follow-up by: Romualdo Bolk, CMA (AAMA),  March 25, 2010 5:07 PM    New/Updated Medications: ISOMETHEPTENE-APAP-DICHLORAL 65-325-100 MG CAPS (APAP-ISOMETHEPTENE-DICHLORAL) take as directed Prescriptions: ISOMETHEPTENE-APAP-DICHLORAL 65-325-100 MG CAPS (APAP-ISOMETHEPTENE-DICHLORAL) take as directed  #30 x 0   Entered by:   Romualdo Bolk, CMA (AAMA)   Authorized by:   Madelin Headings MD   Signed by:   Romualdo Bolk, CMA (AAMA) on 03/25/2010   Method used:   Telephoned to ...       [facility] [address] [phone]* (retail)       [address]       [place], [place]  [address]       Ph: [phone] or [phone]       Fax: [phone]   RxID:   [id number]

## 2010-11-10 ENCOUNTER — Encounter: Payer: Self-pay | Admitting: Internal Medicine

## 2010-12-23 ENCOUNTER — Telehealth: Payer: Self-pay | Admitting: *Deleted

## 2010-12-23 DIAGNOSIS — Z0000 Encounter for general adult medical examination without abnormal findings: Secondary | ICD-10-CM

## 2010-12-23 NOTE — Telephone Encounter (Signed)
Received staff msg pt cpx 01/07/11. Need labs entered in epic...12/23/10@2 :25pm/LMB

## 2010-12-26 ENCOUNTER — Other Ambulatory Visit (INDEPENDENT_AMBULATORY_CARE_PROVIDER_SITE_OTHER): Payer: Federal, State, Local not specified - PPO

## 2010-12-26 ENCOUNTER — Ambulatory Visit: Payer: Federal, State, Local not specified - PPO | Admitting: Internal Medicine

## 2010-12-26 DIAGNOSIS — Z0000 Encounter for general adult medical examination without abnormal findings: Secondary | ICD-10-CM

## 2010-12-26 LAB — BASIC METABOLIC PANEL
BUN: 15 mg/dL (ref 6–23)
Calcium: 9.3 mg/dL (ref 8.4–10.5)
Chloride: 104 mEq/L (ref 96–112)
Creatinine, Ser: 0.7 mg/dL (ref 0.4–1.2)

## 2010-12-26 LAB — CBC WITH DIFFERENTIAL/PLATELET
Basophils Absolute: 0 10*3/uL (ref 0.0–0.1)
Basophils Relative: 0.9 % (ref 0.0–3.0)
Eosinophils Absolute: 0.1 10*3/uL (ref 0.0–0.7)
HCT: 36.1 % (ref 36.0–46.0)
Hemoglobin: 11.9 g/dL — ABNORMAL LOW (ref 12.0–15.0)
Lymphocytes Relative: 40.1 % (ref 12.0–46.0)
Lymphs Abs: 1.3 10*3/uL (ref 0.7–4.0)
MCHC: 33 g/dL (ref 30.0–36.0)
MCV: 82.2 fl (ref 78.0–100.0)
Neutro Abs: 1.5 10*3/uL (ref 1.4–7.7)
RBC: 4.4 Mil/uL (ref 3.87–5.11)
RDW: 13.8 % (ref 11.5–14.6)

## 2010-12-26 LAB — URINALYSIS, ROUTINE W REFLEX MICROSCOPIC
Bilirubin Urine: NEGATIVE
Hgb urine dipstick: NEGATIVE
Ketones, ur: NEGATIVE
Total Protein, Urine: NEGATIVE
Urine Glucose: NEGATIVE
Urobilinogen, UA: 0.2 (ref 0.0–1.0)

## 2010-12-26 LAB — HEPATIC FUNCTION PANEL
ALT: 17 U/L (ref 0–35)
Total Bilirubin: 0.5 mg/dL (ref 0.3–1.2)
Total Protein: 7 g/dL (ref 6.0–8.3)

## 2010-12-26 LAB — LIPID PANEL: Total CHOL/HDL Ratio: 3

## 2010-12-27 ENCOUNTER — Other Ambulatory Visit (HOSPITAL_COMMUNITY)
Admission: RE | Admit: 2010-12-27 | Discharge: 2010-12-27 | Disposition: A | Discharge status: Home / Self Care | Payer: Federal, State, Local not specified - PPO | Source: Ambulatory Visit | Attending: Internal Medicine | Admitting: Internal Medicine

## 2010-12-27 ENCOUNTER — Ambulatory Visit (INDEPENDENT_AMBULATORY_CARE_PROVIDER_SITE_OTHER): Payer: Federal, State, Local not specified - PPO | Admitting: Internal Medicine

## 2010-12-27 ENCOUNTER — Encounter: Payer: Self-pay | Admitting: Internal Medicine

## 2010-12-27 VITALS — BP 100/62 | HR 61 | Temp 97.1°F | Ht 65.0 in | Wt 149.4 lb

## 2010-12-27 DIAGNOSIS — Z124 Encounter for screening for malignant neoplasm of cervix: Secondary | ICD-10-CM

## 2010-12-27 DIAGNOSIS — Z0000 Encounter for general adult medical examination without abnormal findings: Secondary | ICD-10-CM

## 2010-12-27 DIAGNOSIS — Z23 Encounter for immunization: Secondary | ICD-10-CM

## 2010-12-27 DIAGNOSIS — Z01419 Encounter for gynecological examination (general) (routine) without abnormal findings: Secondary | ICD-10-CM | POA: Insufficient documentation

## 2010-12-27 MED ORDER — ESTRADIOL 1.53 MG/SPRAY TD SOLN: 1.0000 | Freq: Every day | TRANSDERMAL | Status: DC

## 2010-12-27 NOTE — Patient Instructions (Signed)
It was good to see you today. We have reviewed your prior records including labs and tests today - everything looks good! Medications reviewed, no changes at this time. Refill on medication(s) as discussed today. PAP/pelivic done today - you will be called with results once received Future PAP/pelvic exam to be done by gynecology office Please schedule followup in 1 year for medical physical and labs, call sooner if problems. Flu shot done today

## 2010-12-27 NOTE — Progress Notes (Signed)
Subjective:    Patient ID: Erica Reilly, female    DOB: [birth year], 50 y.o.   MRN: [id number]  HPI New patient to me today here to establish care.  Previously followed with another provider in our division Dr. Fabian Sharp  patient is here today for annual physical. Patient feels well and has no complaints.  Past Medical History  Diagnosis Date  . HOT FLASHES   . LEUKOPENIA, CHRONIC   . Headache   . Gas   . Allergic rhinitis, mild    Family History  Problem Relation Age of Onset  . Brain cancer Mother   . Diabetes Mother   . Arthritis Mother   . Hypertension Mother   . Colon cancer Father   . Hypertension Sister   . Heart murmur Brother    History  Substance Use Topics  . Smoking status: Never Smoker   . Smokeless tobacco: Not on file  . Alcohol Use: No    Review of Systems Constitutional: Negative for fever or weight change.  Respiratory: Negative for cough and shortness of breath.   Cardiovascular: Negative for chest pain or palpitations.  Gastrointestinal: Negative for abdominal pain, no bowel changes.  Musculoskeletal: Negative for gait problem or joint swelling.  Skin: Negative for rash.  Neurological: Negative for dizziness or change in headache.  No other specific complaints in a complete review of systems (except as listed in HPI above).     Objective:   Physical Exam BP 100/62  Pulse 61  Temp(Src) 97.1 F (36.2 C) (Oral)  Ht 5\' 5"  (1.651 m)  Wt 149 lb 6.4 oz (67.767 kg)  BMI 24.86 kg/m2  SpO2 98% Wt Readings from Last 3 Encounters:  12/27/10 149 lb 6.4 oz (67.767 kg)  03/30/10 154 lb (69.854 kg)  08/24/09 153 lb (69.4 kg)   Constitutional: She appears well-developed and well-nourished. No distress.  HENT: Head: Normocephalic and atraumatic. Ears: B TMs ok, no erythema or effusion; Nose: Nose normal.  Mouth/Throat: Oropharynx is clear and moist. No oropharyngeal exudate.  Eyes: Conjunctivae and EOM are normal. Pupils are equal, round, and reactive to  light. No scleral icterus.  Neck: Normal range of motion. Neck supple. No JVD present. No thyromegaly present.  Breasts: breasts appear normal, no suspicious masses, no skin or nipple changes or axillary nodes. Cardiovascular: Normal rate, regular rhythm and normal heart sounds.  No murmur heard. No BLE edema. Pulmonary/Chest: Effort normal and breath sounds normal. No respiratory distress. She has no wheezes.  Abdominal: Soft. Bowel sounds are normal. She exhibits no distension. There is no tenderness. no masses Musculoskeletal: Normal range of motion, no joint effusions. No gross deformities GU/ Pelvic exam: normal external genitalia, vulva, vagina, cervix, uterus and adnexa, RECTAL: normal rectal, no masses, guaiac negative stool obtained, PAP: Pap smear done today, thin-prep method, exam chaperoned by Orlan Leavens, CMA Neurological: She is alert and oriented to person, place, and time. No cranial nerve deficit. Coordination normal.  Skin: Skin is warm and dry. No rash noted. No erythema.  Psychiatric: She has a normal mood and affect. Her behavior is normal. Judgment and thought content normal.   Lab Results  Component Value Date   WBC 3.2* 12/26/2010   HGB 11.9* 12/26/2010   HCT 36.1 12/26/2010   PLT 169.0 12/26/2010   GLUCOSE 83 12/26/2010   CHOL 193 12/26/2010   TRIG 20.0 12/26/2010   HDL 76.80 12/26/2010   LDLCALC 112* 12/26/2010   ALT 17 12/26/2010   AST 21 12/26/2010  NA 139 12/26/2010   K 3.8 12/26/2010   CL 104 12/26/2010   CREATININE 0.7 12/26/2010   BUN 15 12/26/2010   CO2 29 12/26/2010   TSH 0.96 12/26/2010   WUJ:WJXBJ @ 60 with pause, pr .22     Assessment & Plan:  CPX - v70.0 - Patient has been counseled on age-appropriate routine health concerns for screening and prevention. These are reviewed and up-to-date. Immunizations are up-to-date or declined. Labs and ECG reviewed. PAP/pelvic done today but explained future gyn exams and hormone refills to be done by gyn  ([facility] powell)

## 2011-03-01 ENCOUNTER — Other Ambulatory Visit: Payer: Federal, State, Local not specified - PPO

## 2011-03-07 ENCOUNTER — Encounter: Payer: Federal, State, Local not specified - PPO | Admitting: Internal Medicine

## 2011-06-14 ENCOUNTER — Encounter: Payer: Self-pay | Admitting: Internal Medicine

## 2011-07-10 ENCOUNTER — Encounter: Payer: Self-pay | Admitting: Internal Medicine

## 2011-09-01 ENCOUNTER — Encounter: Payer: Federal, State, Local not specified - PPO | Admitting: Internal Medicine

## 2011-09-08 ENCOUNTER — Encounter: Payer: Self-pay | Admitting: Internal Medicine

## 2011-09-08 ENCOUNTER — Ambulatory Visit (AMBULATORY_SURGERY_CENTER): Payer: Federal, State, Local not specified - PPO

## 2011-09-08 VITALS — Ht 65.0 in | Wt 150.0 lb

## 2011-09-08 DIAGNOSIS — Z1211 Encounter for screening for malignant neoplasm of colon: Secondary | ICD-10-CM

## 2011-09-08 MED ORDER — MOVIPREP 100 G PO SOLR: 1.0000 | Freq: Once | ORAL | Status: DC

## 2011-09-22 ENCOUNTER — Ambulatory Visit (AMBULATORY_SURGERY_CENTER): Payer: Federal, State, Local not specified - PPO | Admitting: Internal Medicine

## 2011-09-22 ENCOUNTER — Encounter: Payer: Self-pay | Admitting: Internal Medicine

## 2011-09-22 VITALS — BP 109/50 | HR 59 | Temp 96.3°F | Resp 15 | Ht 65.0 in | Wt 150.0 lb

## 2011-09-22 DIAGNOSIS — Z800 Family history of malignant neoplasm of digestive organs: Secondary | ICD-10-CM

## 2011-09-22 DIAGNOSIS — Z1211 Encounter for screening for malignant neoplasm of colon: Secondary | ICD-10-CM

## 2011-09-22 MED ORDER — SODIUM CHLORIDE 0.9 % IV SOLN: 500.0000 mL | INTRAVENOUS | Status: DC

## 2011-09-22 NOTE — Patient Instructions (Signed)
YOU HAD AN ENDOSCOPIC PROCEDURE TODAY AT THE [facility]: Refer to the procedure report that was given to you for any specific questions about what was found during the examination.  If the procedure report does not answer your questions, please call your gastroenterologist to clarify.  If you requested that your care partner not be given the details of your procedure findings, then the procedure report has been included in a sealed envelope for you to review at your convenience later.  YOU SHOULD EXPECT: Some feelings of bloating in the abdomen. Passage of more gas than usual.  Walking can help get rid of the air that was put into your GI tract during the procedure and reduce the bloating. If you had a lower endoscopy (such as a colonoscopy or flexible sigmoidoscopy) you may notice spotting of blood in your stool or on the toilet paper. If you underwent a bowel prep for your procedure, then you may not have a normal bowel movement for a few days.  DIET: Your first meal following the procedure should be a light meal and then it is ok to progress to your normal diet.  A half-sandwich or bowl of soup is an example of a good first meal.  Heavy or fried foods are harder to digest and may make you feel nauseous or bloated.  Likewise meals heavy in dairy and vegetables can cause extra gas to form and this can also increase the bloating.  Drink plenty of fluids but you should avoid alcoholic beverages for 24 hours.  ACTIVITY: Your care partner should take you home directly after the procedure.  You should plan to take it easy, moving slowly for the rest of the day.  You can resume normal activity the day after the procedure however you should NOT DRIVE or use heavy machinery for 24 hours (because of the sedation medicines used during the test).    SYMPTOMS TO REPORT IMMEDIATELY: A gastroenterologist can be reached at any hour.  During normal business hours, 8:30 AM to 5:00 PM Monday through Friday,  call [phone].  After hours and on weekends, please call the GI answering service at [phone] who will take a message and have the physician on call contact you.   Following lower endoscopy (colonoscopy or flexible sigmoidoscopy):  Excessive amounts of blood in the stool  Significant tenderness or worsening of abdominal pains  Swelling of the abdomen that is new, acute  Fever of 100F or higher  Following upper endoscopy (EGD)  Vomiting of blood or coffee ground material  New chest pain or pain under the shoulder blades  Painful or persistently difficult swallowing  New shortness of breath  Fever of 100F or higher  Black, tarry-looking stools  FOLLOW UP: If any biopsies were taken you will be contacted by phone or by letter within the next 1-3 weeks.  Call your gastroenterologist if you have not heard about the biopsies in 3 weeks.  Our staff will call the home number listed on your records the next business day following your procedure to check on you and address any questions or concerns that you may have at that time regarding the information given to you following your procedure. This is a courtesy call and so if there is no answer at the home number and we have not heard from you through the emergency physician on call, we will assume that you have returned to your regular daily activities without incident.  SIGNATURES/CONFIDENTIALITY: You and/or your care   partner have signed paperwork which will be entered into your electronic medical record.  These signatures attest to the fact that that the information above on your After Visit Summary has been reviewed and is understood.  Full responsibility of the confidentiality of this discharge information lies with you and/or your care-partner.  

## 2011-09-22 NOTE — Progress Notes (Signed)
Patient did not experience any of the following events: a burn prior to discharge; a fall within the facility; wrong site/side/patient/procedure/implant event; or a hospital transfer or hospital admission upon discharge from the facility. (G8907) Patient did not have preoperative order for IV antibiotic SSI prophylaxis. (G8918)  

## 2011-09-22 NOTE — Op Note (Signed)
[facility] [address] [place], [place]  [address]  COLONOSCOPY PROCEDURE REPORT  PATIENT:  Erica Reilly, Erica Reilly  MR#:  [id number] BIRTHDATE:  1961/01/19, 51 yrs. old  GENDER:  female ENDOSCOPIST:  Hedwig Morton. Juanda Chance, MD REF. BY: PROCEDURE DATE:  09/22/2011 PROCEDURE:  Colonoscopy 19147 ASA CLASS:  Class I INDICATIONS:  family history of colon cancer father with colon cancere, last colon 2008- was normal MEDICATIONS:  None  DESCRIPTION OF PROCEDURE:   After the risks and benefits and of the procedure were explained, informed consent was obtained.  No rectal exam performed. The LB CF-[id number] endoscope was introduced through the anus and advanced to the cecum, which was identified by both the appendix and ileocecal valve.  The quality of the prep was good, using MoviPrep.  The instrument was then slowly withdrawn as the colon was fully examined. <<PROCEDUREIMAGES>>  FINDINGS:  No polyps or cancers were seen (see image1, image2, image3, and image4).   Retroflexed views in the rectum revealed no abnormalities.    The scope was then withdrawn from the patient and the procedure completed.  COMPLICATIONS:  None ENDOSCOPIC IMPRESSION: 1) No polyps or cancers 2) Normal colonoscopy RECOMMENDATIONS: 1) High fiber diet.  REPEAT EXAM:  In 5 year(s) for.  ______________________________ Hedwig Morton. Juanda Chance, MD  CC:  Newt Lukes, MD  n. Rosalie DoctorHedwig Morton. Brodie at 09/22/2011 10:26 AM  Sherryl Barters, [phone]

## 2011-09-25 ENCOUNTER — Telehealth: Payer: Self-pay | Admitting: *Deleted

## 2011-09-25 NOTE — Telephone Encounter (Signed)
  Follow up Call-  Call back number 09/22/2011  Post procedure Call Back phone  # [phone]  Permission to leave phone message Yes     Left message.

## 2011-11-24 ENCOUNTER — Other Ambulatory Visit: Payer: Self-pay | Admitting: Obstetrics and Gynecology

## 2011-11-27 ENCOUNTER — Other Ambulatory Visit: Payer: Self-pay | Admitting: Obstetrics and Gynecology

## 2011-11-27 ENCOUNTER — Telehealth: Payer: Self-pay | Admitting: Obstetrics and Gynecology

## 2011-11-27 MED ORDER — PROGESTERONE MICRONIZED 100 MG PO CAPS: 100.0000 mg | ORAL_CAPSULE | Freq: Every day | ORAL | Status: DC

## 2011-11-27 NOTE — Telephone Encounter (Signed)
Return call to patient who needs refill on her progesterone 100 mg, sent via e-prescription.  Patient had her PAP done last 11/2010-normal and is schedule to have her next "gyn" with her general practitioner since it is more convenient.  Unknown Schleyer, PA-C

## 2011-11-30 ENCOUNTER — Encounter: Payer: Self-pay | Admitting: Obstetrics and Gynecology

## 2011-12-28 ENCOUNTER — Encounter: Payer: Federal, State, Local not specified - PPO | Admitting: Internal Medicine

## 2011-12-31 ENCOUNTER — Other Ambulatory Visit: Payer: Self-pay | Admitting: Obstetrics and Gynecology

## 2012-01-01 NOTE — Telephone Encounter (Signed)
[facility], can this pt have this rx 

## 2012-01-12 ENCOUNTER — Encounter: Payer: Self-pay | Admitting: Internal Medicine

## 2012-02-13 ENCOUNTER — Other Ambulatory Visit: Payer: Self-pay | Admitting: Obstetrics and Gynecology

## 2012-02-16 ENCOUNTER — Other Ambulatory Visit: Payer: Self-pay | Admitting: Obstetrics and Gynecology

## 2012-02-16 MED ORDER — ESTRADIOL 1.53 MG/SPRAY TD SOLN: 2.0000 | Freq: Every day | TRANSDERMAL | Status: DC

## 2012-12-04 ENCOUNTER — Ambulatory Visit: Payer: Federal, State, Local not specified - PPO | Admitting: *Deleted

## 2012-12-10 ENCOUNTER — Encounter: Payer: Self-pay | Admitting: *Deleted

## 2012-12-11 ENCOUNTER — Ambulatory Visit (INDEPENDENT_AMBULATORY_CARE_PROVIDER_SITE_OTHER): Payer: Self-pay | Admitting: *Deleted

## 2012-12-11 DIAGNOSIS — I781 Nevus, non-neoplastic: Secondary | ICD-10-CM

## 2012-12-11 DIAGNOSIS — I83893 Varicose veins of bilateral lower extremities with other complications: Secondary | ICD-10-CM

## 2012-12-11 NOTE — Progress Notes (Signed)
Patient came in to see what her treatment options would be to treat her one reticular vein on her L chin and scattered spiders on the back of her R calf. She has had bilateral GSV laser ablations at [facility]. I told her one syringe would take care of her issues. She will go back to [facility] and see if ins will cover treatments there.

## 2013-07-03 LAB — HM PAP SMEAR: HM Pap smear: NEGATIVE

## 2013-07-08 ENCOUNTER — Encounter: Payer: Self-pay | Admitting: *Deleted

## 2013-07-09 ENCOUNTER — Ambulatory Visit (INDEPENDENT_AMBULATORY_CARE_PROVIDER_SITE_OTHER): Payer: Self-pay | Admitting: *Deleted

## 2013-07-09 DIAGNOSIS — I781 Nevus, non-neoplastic: Secondary | ICD-10-CM

## 2013-07-09 DIAGNOSIS — I83893 Varicose veins of bilateral lower extremities with other complications: Secondary | ICD-10-CM

## 2013-07-09 NOTE — Progress Notes (Signed)
Pt decide against sclero because she didn't remember that I told her she would look worse before she looks better. After lengthy discussion she has rescheduled for Nov 11.

## 2013-07-30 ENCOUNTER — Encounter: Payer: Self-pay | Admitting: *Deleted

## 2013-07-30 ENCOUNTER — Ambulatory Visit (INDEPENDENT_AMBULATORY_CARE_PROVIDER_SITE_OTHER): Payer: Federal, State, Local not specified - PPO | Admitting: Family Medicine

## 2013-07-30 ENCOUNTER — Encounter: Payer: Self-pay | Admitting: Family Medicine

## 2013-07-30 VITALS — BP 100/78 | HR 73 | Temp 98.1°F | Ht 64.75 in | Wt 157.0 lb

## 2013-07-30 DIAGNOSIS — Z0000 Encounter for general adult medical examination without abnormal findings: Secondary | ICD-10-CM

## 2013-07-30 DIAGNOSIS — N951 Menopausal and female climacteric states: Secondary | ICD-10-CM

## 2013-07-30 DIAGNOSIS — R51 Headache: Secondary | ICD-10-CM

## 2013-07-30 NOTE — Progress Notes (Signed)
Pre visit review using our clinic review tool, if applicable. No additional management support is needed unless otherwise documented below in the visit note. 

## 2013-07-30 NOTE — Progress Notes (Addendum)
No chief complaint on file.   HPI:  Erica Reilly is here to establish care. Prior doctor retired. Last PCP and physical: has had physical recently  Has the following chronic problems and concerns today:  Patient Active Problem List   Diagnosis Date Noted  . Rhinitis 03/30/2010  . Gas 03/30/2010  . LEUKOPENIA, CHRONIC 06/08/2009  . HOT FLASHES 09/14/2008  . HEADACHE 09/14/2008   Migraines: -since age 53, takes midrin for this, stable -denies: changes in headaches, vision changes  Hot Flashes: -goes to [place] [facility] for her HRT - PA - [facility] -on HRT for 3 years and this seems to control the hot flashes -denies: hot flashes, blood clots  Health Maintenance: -FH colon cancer and getting regular colonoscopy  ROS: See pertinent positives and negatives per HPI.  Past Medical History  Diagnosis Date  . HOT FLASHES   . LEUKOPENIA, CHRONIC   . Headache(784.0)   . Gas   . Allergic rhinitis, mild     Family History  Problem Relation Age of Onset  . Brain cancer Mother   . Diabetes Mother   . Arthritis Mother   . Hypertension Mother   . Colon cancer Father   . Hypertension Sister   . Heart murmur Brother     History   Social History  . Marital Status: Married    Spouse Name: N/A    Number of Children: N/A  . Years of Education: N/A   Social History Main Topics  . Smoking status: Never Smoker   . Smokeless tobacco: None  . Alcohol Use: No  . Drug Use: No  . Sexual Activity: None   Other Topics Concern  . None   Social History Narrative   Lives with husband   Works at the [employer].   Exercises on regular basis; Eating healthy   Christian    Current outpatient prescriptions:estradiol (EVAMIST) 1.53 MG/SPRAY transdermal spray, Place 2 sprays onto the skin daily., Disp: 8.1 mL, Rfl: 11;  progesterone (PROMETRIUM) 100 MG capsule, Take 1 capsule (100 mg total) by mouth daily., Disp: 30 capsule, Rfl: 11;  isometheptene-acetaminophen-dichloralphenazone  (MIDRIN) 65-325-100 MG per capsule, Take 1 capsule by mouth 4 (four) times daily as needed.  , Disp: , Rfl:  meloxicam (MOBIC) 15 MG tablet, , Disp: , Rfl:   EXAM:  Filed Vitals:   07/30/13 0823  BP: 100/78  Pulse: 73  Temp: 98.1 F (36.7 C)    Body mass index is 26.32 kg/(m^2).  GENERAL: vitals reviewed and listed above, alert, oriented, appears well hydrated and in no acute distress  HEENT: atraumatic, conjunttiva clear, no obvious abnormalities on inspection of external nose and ears  NECK: no obvious masses on inspection  LUNGS: clear to auscultation bilaterally, no wheezes, rales or rhonchi, good air movement  CV: HRRR, no peripheral edema  MS: moves all extremities without noticeable abnormality  PSYCH: pleasant and cooperative, no obvious depression or anxiety  ASSESSMENT AND PLAN:  Discussed the following assessment and plan:  Visit for preventive health examination  HOT FLASHES  HEADACHE  -We reviewed the PMH, PSH, FH, SH, Meds and Allergies. -We provided refills for any medications we will prescribe as needed. -We addressed current concerns per orders and patient instructions. -We have asked for records for pertinent exams, studies, vaccines and notes from previous providers. -We have advised patient to follow up per instructions below. -follow up as needed  -Patient advised to return or notify a doctor immediately if symptoms worsen or persist  or new concerns arise.  Patient Instructions  -PLEASE SIGN UP FOR MYCHART TODAY   We recommend the following healthy lifestyle measures: - eat a healthy diet consisting of lots of vegetables, fruits, beans, nuts, seeds, healthy meats such as white chicken and fish and whole grains.  - avoid fried foods, fast food, processed foods, sodas, red meet and other fattening foods.  - get a least 150 minutes of aerobic exercise per week.   Follow up in: as needed      Lucretia Kern

## 2013-07-30 NOTE — Patient Instructions (Signed)
-  PLEASE SIGN UP FOR MYCHART TODAY   We recommend the following healthy lifestyle measures: - eat a healthy diet consisting of lots of vegetables, fruits, beans, nuts, seeds, healthy meats such as white chicken and fish and whole grains.  - avoid fried foods, fast food, processed foods, sodas, red meet and other fattening foods.  - get a least 150 minutes of aerobic exercise per week.   Follow up in: as needed

## 2013-08-12 ENCOUNTER — Ambulatory Visit (INDEPENDENT_AMBULATORY_CARE_PROVIDER_SITE_OTHER): Payer: Federal, State, Local not specified - PPO | Admitting: Family Medicine

## 2013-08-12 ENCOUNTER — Encounter: Payer: Self-pay | Admitting: Family Medicine

## 2013-08-12 VITALS — BP 104/70 | HR 82 | Temp 98.8°F | Ht 64.75 in | Wt 155.0 lb

## 2013-08-12 DIAGNOSIS — J310 Chronic rhinitis: Secondary | ICD-10-CM

## 2013-08-12 DIAGNOSIS — J329 Chronic sinusitis, unspecified: Secondary | ICD-10-CM

## 2013-08-12 NOTE — Patient Instructions (Signed)
-  Afrin for 4 days (twice daily) then STOP  -nasocort daily for 1 month  -claritin daily   -follow up in 1 month if needed

## 2013-08-12 NOTE — Progress Notes (Signed)
Chief Complaint  Patient presents with  . Sore Throat    HPI:   -started: about 1 week ago -symptoms:nasal congestion, sore throat, cough, PND, hoarseness, a little cough and sneezing -denies:fever, SOB, NVD, tooth pain -has tried: hot and cold liquids help, nyquil, allergy day medicine, herbal vit c -sick contacts/travel/risks: denies flu exposure, tick exposure or or Ebola risks -Hx of: allergies  ROS: See pertinent positives and negatives per HPI.  Past Medical History  Diagnosis Date  . HOT FLASHES   . LEUKOPENIA, CHRONIC   . Headache(784.0)   . Gas   . Allergic rhinitis, mild     Past Surgical History  Procedure Laterality Date  . No past surgeries    . Colonoscopy      2003 2008    Family History  Problem Relation Age of Onset  . Brain cancer Mother   . Diabetes Mother   . Arthritis Mother   . Hypertension Mother   . Colon cancer Father   . Hypertension Sister   . Heart murmur Brother     History   Social History  . Marital Status: Married    Spouse Name: N/A    Number of Children: N/A  . Years of Education: N/A   Social History Main Topics  . Smoking status: Never Smoker   . Smokeless tobacco: None  . Alcohol Use: No  . Drug Use: No  . Sexual Activity: None   Other Topics Concern  . None   Social History Narrative   Lives with husband   Works at the [employer] office.   Exercises on regular basis; Eating healthy   Christian    Current outpatient prescriptions:ACYCLOVIR PO, Take by mouth., Disp: , Rfl: ;  estradiol (EVAMIST) 1.53 MG/SPRAY transdermal spray, Place 2 sprays onto the skin daily., Disp: 8.1 mL, Rfl: 11;  isometheptene-acetaminophen-dichloralphenazone (MIDRIN) 65-325-100 MG per capsule, Take 1 capsule by mouth 4 (four) times daily as needed.  , Disp: , Rfl: ;  meloxicam (MOBIC) 15 MG tablet, , Disp: , Rfl:  progesterone (PROMETRIUM) 100 MG capsule, Take 1 capsule (100 mg total) by mouth daily., Disp: 30 capsule, Rfl:  11  EXAM:  Filed Vitals:   08/12/13 0811  BP: 104/70  Pulse: 82  Temp: 98.8 F (37.1 C)    Body mass index is 25.98 kg/(m^2).  GENERAL: vitals reviewed and listed above, alert, oriented, appears well hydrated and in no acute distress  HEENT: atraumatic, conjunttiva clear, no obvious abnormalities on inspection of external nose and ears, normal appearance of ear canals and TMs, clear nasal congestion, mild post oropharyngeal erythema with PND, no tonsillar edema or exudate, no sinus TTP  NECK: no obvious masses on inspection  LUNGS: clear to auscultation bilaterally, no wheezes, rales or rhonchi, good air movement  CV: HRRR, no peripheral edema  MS: moves all extremities without noticeable abnormality  PSYCH: pleasant and cooperative, no obvious depression or anxiety  ASSESSMENT AND PLAN:  Discussed the following assessment and plan:  Rhinosinusitis  -given HPI and exam findings today, a serious infection or illness is unlikely. We discussed potential etiologies, with allergic versus viral etiology being most likely, and advised supportive care and monitoring. We discussed treatment side effects, likely course, antibiotic misuse, transmission, and signs of developing a serious illness. -of course, we advised to return or notify a doctor immediately if symptoms worsen or persist or new concerns arise.    Patient Instructions  -Afrin for 4 days (twice daily) then STOP  -nasocort  daily for 1 month  -claritin daily   -follow up in 1 month if needed     Mariaclara Spear R.

## 2013-08-12 NOTE — Progress Notes (Signed)
Pre visit review using our clinic review tool, if applicable. No additional management support is needed unless otherwise documented below in the visit note. 

## 2013-09-05 ENCOUNTER — Telehealth: Payer: Self-pay | Admitting: Family Medicine

## 2013-09-05 NOTE — Telephone Encounter (Signed)
Pt called to request something to help her sleep. Said she is stress from her job and she needs something

## 2013-09-05 NOTE — Telephone Encounter (Signed)
Advise appointment. Can try 1/2 dose of benadryl or unisom otc in interim if she wishes.

## 2013-09-05 NOTE — Telephone Encounter (Signed)
I left a message at the pts cell number to return my call. 

## 2013-09-08 NOTE — Telephone Encounter (Signed)
Patient returned my call and left a message stating she will try an OTC herbal medication first and to disregard her message at this time.

## 2013-09-08 NOTE — Telephone Encounter (Signed)
I left a message at the pts cell and home number to return my call.

## 2013-12-08 LAB — HM MAMMOGRAPHY

## 2013-12-10 ENCOUNTER — Encounter: Payer: Self-pay | Admitting: Family Medicine

## 2014-01-06 ENCOUNTER — Encounter: Payer: Self-pay | Admitting: *Deleted

## 2014-01-07 ENCOUNTER — Ambulatory Visit (INDEPENDENT_AMBULATORY_CARE_PROVIDER_SITE_OTHER): Payer: Federal, State, Local not specified - PPO | Admitting: *Deleted

## 2014-01-07 DIAGNOSIS — I8393 Asymptomatic varicose veins of bilateral lower extremities: Secondary | ICD-10-CM

## 2014-01-07 DIAGNOSIS — I780 Hereditary hemorrhagic telangiectasia: Secondary | ICD-10-CM

## 2014-01-07 NOTE — Progress Notes (Signed)
X=.3% Sotradecol administered with a 27g butterfly.  Patient received a total of 6cc foam.  Treated all areas of concern. Easy access and tol well. Anticipate good results for this nice lady. Will follow prn.  Photos: Yes.    Compression stockings applied: Yes.  and ace over retic L leg

## 2014-01-08 ENCOUNTER — Encounter: Payer: Self-pay | Admitting: *Deleted

## 2014-07-30 ENCOUNTER — Other Ambulatory Visit: Payer: Self-pay | Admitting: Family Medicine

## 2014-07-31 NOTE — Telephone Encounter (Signed)
Patient states she takes this for cold sores

## 2014-07-31 NOTE — Telephone Encounter (Signed)
I don't know if we have rxd this before? Can you find out what she takes if for and add to [place]. If for cold sores or genital herpes and has taken before and only uses for outbreaks ok to send 400mg , tid for 5-7 days with outbreak, # 21 with 3 refills. Thanks.

## 2014-11-11 ENCOUNTER — Telehealth: Payer: Self-pay | Admitting: Family Medicine

## 2014-11-11 NOTE — Telephone Encounter (Signed)
Pt states she indicated to dr Maudie Mercury that she is on isometheptene-acetaminophen-dichloralphenazone (MIDRIN) 65-325-100 MG per capsule  Pt has run out and dr Maudie Mercury has not prescribed yet. Pt request this refill   [facility] / [place]

## 2014-11-13 ENCOUNTER — Other Ambulatory Visit: Payer: Self-pay | Admitting: *Deleted

## 2014-11-13 MED ORDER — ISOMETHEPTENE-DICHLORAL-APAP 65-100-325 MG PO CAPS: 1.0000 | ORAL_CAPSULE | Freq: Four times a day (QID) | ORAL | Status: DC | PRN

## 2014-11-16 ENCOUNTER — Telehealth: Payer: Self-pay | Admitting: Family Medicine

## 2014-11-16 DIAGNOSIS — Z0000 Encounter for general adult medical examination without abnormal findings: Secondary | ICD-10-CM

## 2014-11-16 NOTE — Telephone Encounter (Signed)
Pt would like to have her CPE before the end of the year, preferably Nov1st.  Pt is concerned that her cholesteral is elevated and will need to fast. However, first appt that day and any day through the end of the year is 1:15 pm. Is it ok to work in earlier appt, or have pt come in prior to visit?

## 2014-11-16 NOTE — Telephone Encounter (Signed)
Ok to have pt come 1 week prior to physical for labs. Thanks. Labs: hgba1c, lipid panel.

## 2014-11-16 NOTE — Addendum Note (Signed)
Addended by: Agnes Lawrence on: 11/16/2014 10:23 AM   Modules accepted: Orders

## 2014-11-16 NOTE — Telephone Encounter (Signed)
Orders entered

## 2014-11-16 NOTE — Telephone Encounter (Signed)
Lm on vm to cb °

## 2014-11-19 NOTE — Telephone Encounter (Signed)
Orders re-entered

## 2014-11-19 NOTE — Addendum Note (Signed)
Addended by: Agnes Lawrence on: 11/19/2014 09:37 AM   Modules accepted: Orders

## 2014-11-19 NOTE — Telephone Encounter (Signed)
Pt would like to go to [facility] for her labs, due to her work schedule . Can you put orders in for there?

## 2014-12-23 ENCOUNTER — Other Ambulatory Visit (INDEPENDENT_AMBULATORY_CARE_PROVIDER_SITE_OTHER): Payer: Federal, State, Local not specified - PPO

## 2014-12-23 DIAGNOSIS — Z0000 Encounter for general adult medical examination without abnormal findings: Secondary | ICD-10-CM

## 2014-12-23 LAB — LIPID PANEL
CHOL/HDL RATIO: 3
Cholesterol: 195 mg/dL (ref 0–200)
HDL: 66.8 mg/dL (ref >39.00)
LDL Cholesterol: 119 mg/dL — ABNORMAL HIGH (ref 0–99)
NONHDL: 128.62
Triglycerides: 46 mg/dL (ref 0.0–149.0)
VLDL: 9.2 mg/dL (ref 0.0–40.0)

## 2014-12-23 LAB — HEMOGLOBIN A1C: HEMOGLOBIN A1C: 5.8 % (ref 4.6–6.5)

## 2014-12-28 ENCOUNTER — Encounter: Payer: Self-pay | Admitting: Family Medicine

## 2014-12-29 ENCOUNTER — Ambulatory Visit (INDEPENDENT_AMBULATORY_CARE_PROVIDER_SITE_OTHER): Payer: Federal, State, Local not specified - PPO | Admitting: Family Medicine

## 2014-12-29 ENCOUNTER — Encounter: Payer: Self-pay | Admitting: Family Medicine

## 2014-12-29 VITALS — BP 100/80 | HR 68 | Temp 98.4°F | Ht 64.34 in | Wt 161.8 lb

## 2014-12-29 DIAGNOSIS — E785 Hyperlipidemia, unspecified: Secondary | ICD-10-CM | POA: Diagnosis not present

## 2014-12-29 DIAGNOSIS — R739 Hyperglycemia, unspecified: Secondary | ICD-10-CM

## 2014-12-29 DIAGNOSIS — Z0000 Encounter for general adult medical examination without abnormal findings: Secondary | ICD-10-CM | POA: Diagnosis not present

## 2014-12-29 NOTE — Patient Instructions (Signed)
BEFORE YOU LEAVE: -follow up visit in 4-6 months  Please obtain last pap report from your gynecologist and forward to our office  We recommend the following healthy lifestyle measures: - eat a healthy whole foods diet consisting of regular small meals composed of vegetables, fruits, beans, nuts, seeds, healthy meats such as white chicken and fish and whole grains.  - avoid sweets, white starchy foods, fried foods, fast food, processed foods, sodas, red meet and other fattening foods.  - get a least 150-300 minutes of aerobic exercise per week.

## 2014-12-29 NOTE — Progress Notes (Signed)
HPI:  Here for CPE:  -Concerns and/or follow up today:   Prediabetes/Hyperlipidemia: -on recent labs -diet and exercise: reports does get some regular exercise and has cut down on sweets -denies:polyuria, polydipsia, vision changes  -Diet: variety of foods, balance and well rounded  -Exercise:  regular exercise  -Taking folic acid, vitamin D or calcium: no  -Diabetes and Dyslipidemia Screening: done  -Hx of HTN: no  -Vaccines: UTD  -pap history: done with gyn, she will obtain report  -FDLMP:n/a  -sexual activity: yes, female partner, no new partners  -wants STI testing (Hep C if born [birth year]): no  -Alcohol, Tobacco, drug use: see social history  Review of Systems - no fevers, unintentional weight loss, vision loss, hearing loss, chest pain, sob, hemoptysis, melena, hematochezia, hematuria, genital discharge, changing or concerning skin lesions, bleeding, bruising, loc, thoughts of self harm or SI  Past Medical History  Diagnosis Date  . HOT FLASHES   . LEUKOPENIA, CHRONIC   . Headache(784.0)   . Gas   . Allergic rhinitis, mild     Past Surgical History  Procedure Laterality Date  . No past surgeries    . Colonoscopy      2003 2008    Family History  Problem Relation Age of Onset  . Brain cancer Mother   . Diabetes Mother   . Arthritis Mother   . Hypertension Mother   . Colon cancer Father   . Hypertension Sister   . Heart murmur Brother     Social History   Social History  . Marital Status: Married    Spouse Name: N/A  . Number of Children: N/A  . Years of Education: N/A   Social History Main Topics  . Smoking status: Never Smoker   . Smokeless tobacco: None  . Alcohol Use: No  . Drug Use: No  . Sexual Activity: Not Asked   Other Topics Concern  . None   Social History Narrative   Lives with husband   Works at the [employer].   Exercises on regular basis; Eating healthy   Christian     Current outpatient prescriptions:  .   acyclovir (ZOVIRAX) 400 MG tablet, TAKE 1 TABLET BY MOUTH THREE TIMES DAILY FOR 5-7 DAYS AS NEEDED FOR OUTBREAK, Disp: 21 tablet, Rfl: 3 .  ACYCLOVIR PO, Take by mouth., Disp: , Rfl:  .  estradiol (EVAMIST) 1.53 MG/SPRAY transdermal spray, Place 2 sprays onto the skin daily., Disp: 8.1 mL, Rfl: 11 .  isometheptene-acetaminophen-dichloralphenazone (MIDRIN) 65-100-325 MG capsule, Take 1 capsule by mouth 4 (four) times daily as needed for migraine. Maximum 5 capsules in 12 hours for migraine headaches, 8 capsules in 24 hours for tension headaches., Disp: 30 capsule, Rfl: 1 .  progesterone (PROMETRIUM) 100 MG capsule, Take 1 capsule (100 mg total) by mouth daily., Disp: 30 capsule, Rfl: 11  EXAM:  Filed Vitals:   12/29/14 1327  BP: 100/80  Pulse: 68  Temp: 98.4 F (36.9 C)    GENERAL: vitals reviewed and listed below, alert, oriented, appears well hydrated and in no acute distress  HEENT: head atraumatic, PERRLA, normal appearance of eyes, ears, nose and mouth. moist mucus membranes.  NECK: supple, no masses or lymphadenopathy  LUNGS: clear to auscultation bilaterally, no rales, rhonchi or wheeze  CV: HRRR, no peripheral edema or cyanosis, normal pedal pulses  BREAST: normal appearance - no lesions or discharge, on palpation normal breast tissue without any suspicious masses  ABDOMEN: bowel sounds normal, soft, non tender to  palpation, no masses, no rebound or guarding  GU: declined   SKIN: no rash or abnormal lesions  MS: normal gait, moves all extremities normally  NEURO: CN II-XII grossly intact, normal muscle strength and sensation to light touch on extremities  PSYCH: normal affect, pleasant and cooperative  ASSESSMENT AND PLAN:  Discussed the following assessment and plan:  Visit for preventive health examination  Hyperglycemia  Hyperlipemia  -Discussed and advised all [place] preventive services health task force level A and B recommendations for age, sex and  risks.  -Advised at least 150 minutes of exercise per week and a healthy diet low in saturated fats and sweets and consisting of fresh fruits and vegetables, lean meats such as fish and white chicken and whole grains.  -labs, studies and vaccines per orders this encounter  No orders of the defined types were placed in this encounter.    Patient advised to return to clinic immediately if symptoms worsen or persist or new concerns.  There are no Patient Instructions on file for this visit.  No Follow-up on file.  Colin Benton R.

## 2014-12-29 NOTE — Progress Notes (Signed)
Pre visit review using our clinic review tool, if applicable. No additional management support is needed unless otherwise documented below in the visit note. 

## 2015-02-05 ENCOUNTER — Encounter: Payer: Self-pay | Admitting: Family Medicine

## 2015-06-25 ENCOUNTER — Telehealth: Payer: Self-pay | Admitting: Family Medicine

## 2015-06-25 NOTE — Telephone Encounter (Signed)
I left a detailed message at the pts cell number with the information below. 

## 2015-06-25 NOTE — Telephone Encounter (Signed)
No labs needed for sure. May order labs if needed depending on her concerns and our discussion. She does not need to fast.

## 2015-06-25 NOTE — Telephone Encounter (Signed)
Pt would like to know if labs will be done at her appointment 5/1 and discuss during the appointment?

## 2015-06-28 ENCOUNTER — Ambulatory Visit: Payer: Federal, State, Local not specified - PPO | Admitting: Family Medicine

## 2015-07-18 ENCOUNTER — Other Ambulatory Visit: Payer: Self-pay | Admitting: Family Medicine

## 2015-07-20 ENCOUNTER — Ambulatory Visit: Payer: Federal, State, Local not specified - PPO | Admitting: Family Medicine

## 2015-07-22 ENCOUNTER — Ambulatory Visit (INDEPENDENT_AMBULATORY_CARE_PROVIDER_SITE_OTHER): Payer: Federal, State, Local not specified - PPO | Admitting: Family Medicine

## 2015-07-22 ENCOUNTER — Encounter: Payer: Self-pay | Admitting: Family Medicine

## 2015-07-22 VITALS — BP 102/74 | HR 91 | Temp 98.0°F | Ht 64.34 in | Wt 153.3 lb

## 2015-07-22 DIAGNOSIS — Z634 Disappearance and death of family member: Secondary | ICD-10-CM

## 2015-07-22 DIAGNOSIS — E785 Hyperlipidemia, unspecified: Secondary | ICD-10-CM | POA: Diagnosis not present

## 2015-07-22 DIAGNOSIS — R739 Hyperglycemia, unspecified: Secondary | ICD-10-CM | POA: Diagnosis not present

## 2015-07-22 NOTE — Progress Notes (Signed)
HPI:  Erica Reilly is a pleasant 55 year old here for an acute visit for several concerns:  Grief/Bereavement: -Her sister was killed last week suddenly, ran over by a truck -She is grieving, but feels is handling things okay despite sadness, frequent crying and shock -Has good support -Her sister was a caregiver for her ill mother, and now patient is having to pick up the slack they are -Patient also has caregiver for her brother -She needs time off from work to care for her family -She did get some time from work for the death -no hallucinations, SI, psychosis, panic attacks significant cognitive dysfunction, inability to perform work tasks due to her sadness  Hyperglycemia/Hyperlipidemia: -She asked about the labs were done at her last physical and ways to help correct -She admits that her diet and exercise and has not been as good as she has gotten older -No polyuria, polydipsia or vision changes ROS: See pertinent positives and negatives per HPI.  Past Medical History  Diagnosis Date  . HOT FLASHES   . LEUKOPENIA, CHRONIC   . Headache(784.0)   . Gas   . Allergic rhinitis, mild     Past Surgical History  Procedure Laterality Date  . No past surgeries    . Colonoscopy      2003 2008    Family History  Problem Relation Age of Onset  . Brain cancer Mother   . Diabetes Mother   . Arthritis Mother   . Hypertension Mother   . Colon cancer Father   . Hypertension Sister   . Heart murmur Brother     Social History   Social History  . Marital Status: Married    Spouse Name: N/A  . Number of Children: N/A  . Years of Education: N/A   Social History Main Topics  . Smoking status: Never Smoker   . Smokeless tobacco: None  . Alcohol Use: No  . Drug Use: No  . Sexual Activity: Not Asked   Other Topics Concern  . None   Social History Narrative   Lives with husband   Works at the post office.   Exercises on regular basis; Eating healthy   Christian      Current outpatient prescriptions:  .  acyclovir (ZOVIRAX) 400 MG tablet, TAKE 1 TABLET BY MOUTH THREE TIMES DAILY FOR 5 TO 7 DAYS AS NEEDED FOR OUTBREAK, Disp: 21 tablet, Rfl: 0 .  estradiol (EVAMIST) 1.53 MG/SPRAY transdermal spray, Place 2 sprays onto the skin daily., Disp: 8.1 mL, Rfl: 11 .  isometheptene-acetaminophen-dichloralphenazone (MIDRIN) 65-100-325 MG capsule, Take 1 capsule by mouth 4 (four) times daily as needed for migraine. Maximum 5 capsules in 12 hours for migraine headaches, 8 capsules in 24 hours for tension headaches., Disp: 30 capsule, Rfl: 1 .  progesterone (PROMETRIUM) 100 MG capsule, Take 1 capsule (100 mg total) by mouth daily., Disp: 30 capsule, Rfl: 11  EXAM:  Filed Vitals:   07/22/15 1006  BP: 102/74  Pulse: 91  Temp: 98 F (36.7 C)    Body mass index is 26.04 kg/(m^2).  GENERAL: vitals reviewed and listed above, alert, oriented, appears well hydrated and in no acute distress  HEENT: atraumatic, conjunttiva clear, no obvious abnormalities on inspection of external nose and ears  NECK: no obvious masses on inspection  LUNGS: clear to auscultation bilaterally, no wheezes, rales or rhonchi, good air movement  CV: HRRR, no peripheral edema  MS: moves all extremities without noticeable abnormality  PSYCH: pleasant and cooperative, no obvious  depression or anxiety, tearful at times   ASSESSMENT AND PLAN:  Discussed the following assessment and plan:  Bereavement -Discussed stages of grief and treatment -Advised medication not usually recommended and she is not interested in medications as does not feel symptoms are severe -She has good support -Advised of resources for grief counseling and she will consider -FMLA for care of her family would come from physicians treating those family members, she plans to look into this  Hyperglycemia Hyperlipidemia -Lifestyle recommendations discussed at length per her request  -Advised healthy low carb  diet, small portion sizes and regular aerobic exercise  -We'll plan to recheck labs at her physical   -Patient advised to return or notify a doctor immediately if symptoms worsen or persist or new concerns arise.  Patient Instructions  BEFORE YOU LEAVE: -physical in November; come fasting if possible for labs the same visit  Consider Hospice and Palliative care grief counseling  We recommend the following healthy lifestyle measures: - eat a healthy whole foods diet consisting of regular small meals composed of vegetables, fruits, beans, nuts, seeds, healthy meats such as white chicken and fish and whole grains.  - avoid sweets, white starchy foods, fried foods, fast food, processed foods, sodas, red meet  - get a least 150-300 minutes of aerobic exercise per week.       Colin Benton R.

## 2015-07-22 NOTE — Progress Notes (Signed)
Pre visit review using our clinic review tool, if applicable. No additional management support is needed unless otherwise documented below in the visit note. 

## 2015-07-22 NOTE — Patient Instructions (Signed)
BEFORE YOU LEAVE: -physical in November; come fasting if possible for labs the same visit  Consider Hospice and Palliative care grief counseling  We recommend the following healthy lifestyle measures: - eat a healthy whole foods diet consisting of regular small meals composed of vegetables, fruits, beans, nuts, seeds, healthy meats such as white chicken and fish and whole grains.  - avoid sweets, white starchy foods, fried foods, fast food, processed foods, sodas, red meet  - get a least 150-300 minutes of aerobic exercise per week.

## 2015-07-27 DIAGNOSIS — Z634 Disappearance and death of family member: Secondary | ICD-10-CM | POA: Diagnosis not present

## 2015-07-27 DIAGNOSIS — Z789 Other specified health status: Secondary | ICD-10-CM | POA: Diagnosis not present

## 2015-07-27 DIAGNOSIS — R5383 Other fatigue: Secondary | ICD-10-CM | POA: Diagnosis not present

## 2015-07-27 DIAGNOSIS — Z79899 Other long term (current) drug therapy: Secondary | ICD-10-CM | POA: Diagnosis not present

## 2015-08-30 DIAGNOSIS — L669 Cicatricial alopecia, unspecified: Secondary | ICD-10-CM | POA: Diagnosis not present

## 2015-08-30 DIAGNOSIS — L089 Local infection of the skin and subcutaneous tissue, unspecified: Secondary | ICD-10-CM | POA: Diagnosis not present

## 2015-10-05 DIAGNOSIS — K080 Exfoliation of teeth due to systemic causes: Secondary | ICD-10-CM | POA: Diagnosis not present

## 2015-10-08 ENCOUNTER — Other Ambulatory Visit: Payer: Self-pay | Admitting: Family Medicine

## 2015-12-01 DIAGNOSIS — G43809 Other migraine, not intractable, without status migrainosus: Secondary | ICD-10-CM | POA: Diagnosis not present

## 2015-12-01 DIAGNOSIS — Z79890 Hormone replacement therapy (postmenopausal): Secondary | ICD-10-CM | POA: Diagnosis not present

## 2015-12-16 DIAGNOSIS — Z1231 Encounter for screening mammogram for malignant neoplasm of breast: Secondary | ICD-10-CM | POA: Diagnosis not present

## 2015-12-16 DIAGNOSIS — Z803 Family history of malignant neoplasm of breast: Secondary | ICD-10-CM | POA: Diagnosis not present

## 2016-01-04 ENCOUNTER — Encounter: Payer: Federal, State, Local not specified - PPO | Admitting: Family Medicine

## 2016-02-28 HISTORY — PX: KNEE ARTHROSCOPY: SHX127

## 2016-02-29 ENCOUNTER — Ambulatory Visit (INDEPENDENT_AMBULATORY_CARE_PROVIDER_SITE_OTHER): Payer: Federal, State, Local not specified - PPO | Admitting: Podiatry

## 2016-02-29 ENCOUNTER — Encounter: Payer: Self-pay | Admitting: Podiatry

## 2016-02-29 VITALS — BP 115/79 | HR 76 | Resp 14 | Ht 65.0 in | Wt 155.0 lb

## 2016-02-29 DIAGNOSIS — M722 Plantar fascial fibromatosis: Secondary | ICD-10-CM | POA: Diagnosis not present

## 2016-02-29 NOTE — Progress Notes (Signed)
   Subjective:    Patient ID: Erica Reilly, female    DOB: 1960/10/14, 56 y.o.   MRN: [id number]  HPI    This patient presents today complaining of ongoing bilateral inferior heel pain. The symptoms have been rather chronic since 2005. Describes pain upon initial weightbearing and with prolonged standing walking relieved with rest and elevation. Patient wears custom foot orthotics rigid and semirigid ongoing since 2005. She says the orthotics reduce her symptoms significantly. Patient is requesting refurbishing and replacement of orthotics.  Review of Systems  All other systems reviewed and are negative.      Objective:   Physical Exam  Patient is approximated 5 foot 5 inches and weighs approximately 155 pounds  Orientated 3  Vascular: DP and PT pulses 2/4 bilaterally Capillary reflex immediate bilaterally  Neurological: Sensation to 10 g monofilament wire intact 5/5 bilaterally Auditory sensation reactive bilaterally Ankle reflex equal and reactive bilaterally  Dermatological: Well-healed surgical scars fifth toes bilaterally No open skin lesions bilaterally  Musculoskeletal: Manual motor testing dorsi flexion, plantar flexion, inversion, eversion 5/5 bilaterally Palpable tenderness medial plantar fascial insertional area bilaterally Semirigid orthotic with extrinsic post contour satisfactorily with the top cover to the sulcus partially torn Rigid orthotic with extrinsic post with satisfactory top cover with minimal cracking contour satisfactorily      Assessment & Plan:   Assessment: Bilateral plantar fasciitis  Plan: Discuss treatment options including stretching, continuous orthotic maintenance and crack shoeing. Also patient was questioning surgical treatment which we discussed and patient was not interested in surgical treatment  Replace top cover on semirigid orthotic to sulcus Digital scan obtained for replacement rigid orthotic Poll Pro rigid Extrinsic  post Forefoot intrinsic Three-quarter length  Notify patient upon receipt of recovered orthotics and dispensing of new orthotics

## 2016-02-29 NOTE — Patient Instructions (Signed)
Today we will recover the semirigid orthotics and scan for replacement rigid orthotics   Plantar Fasciitis Plantar fasciitis is a painful foot condition that affects the heel. It occurs when the band of tissue that connects the toes to the heel bone (plantar fascia) becomes irritated. This can happen after exercising too much or doing other repetitive activities (overuse injury). The pain from plantar fasciitis can range from mild irritation to severe pain that makes it difficult for you to walk or move. The pain is usually worse in the morning or after you have been sitting or lying down for a while. CAUSES This condition may be caused by:  Standing for long periods of time.  Wearing shoes that do not fit.  Doing high-impact activities, including running, aerobics, and ballet.  Being overweight.  Having an abnormal way of walking (gait).  Having tight calf muscles.  Having high arches in your feet.  Starting a new athletic activity. SYMPTOMS The main symptom of this condition is heel pain. Other symptoms include:  Pain that gets worse after activity or exercise.  Pain that is worse in the morning or after resting.  Pain that goes away after you walk for a few minutes. DIAGNOSIS This condition may be diagnosed based on your signs and symptoms. Your health care provider will also do a physical exam to check for:  A tender area on the bottom of your foot.  A high arch in your foot.  Pain when you move your foot.  Difficulty moving your foot. You may also need to have imaging studies to confirm the diagnosis. These can include:  X-rays.  Ultrasound.  MRI. TREATMENT  Treatment for plantar fasciitis depends on the severity of the condition. Your treatment may include:  Rest, ice, and over-the-counter pain medicines to manage your pain.  Exercises to stretch your calves and your plantar fascia.  A splint that holds your foot in a stretched, upward position while you  sleep (night splint).  Physical therapy to relieve symptoms and prevent problems in the future.  Cortisone injections to relieve severe pain.  Extracorporeal shock wave therapy (ESWT) to stimulate damaged plantar fascia with electrical impulses. It is often used as a last resort before surgery.  Surgery, if other treatments have not worked after 12 months. HOME CARE INSTRUCTIONS  Take medicines only as directed by your health care provider.  Avoid activities that cause pain.  Roll the bottom of your foot over a bag of ice or a bottle of cold water. Do this for 20 minutes, 3-4 times a day.  Perform simple stretches as directed by your health care provider.  Try wearing athletic shoes with air-sole or gel-sole cushions or soft shoe inserts.  Wear a night splint while sleeping, if directed by your health care provider.  Keep all follow-up appointments with your health care provider. PREVENTION   Do not perform exercises or activities that cause heel pain.  Consider finding low-impact activities if you continue to have problems.  Lose weight if you need to. The best way to prevent plantar fasciitis is to avoid the activities that aggravate your plantar fascia. SEEK MEDICAL CARE IF:  Your symptoms do not go away after treatment with home care measures.  Your pain gets worse.  Your pain affects your ability to move or do your daily activities. This information is not intended to replace advice given to you by your health care provider. Make sure you discuss any questions you have with your health care  provider. Document Released: 11/08/2000 Document Revised: 06/07/2015 Document Reviewed: 12/24/2013 Elsevier Interactive Patient Education  2017 [employer].

## 2016-03-10 DIAGNOSIS — R52 Pain, unspecified: Secondary | ICD-10-CM

## 2016-03-13 DIAGNOSIS — R6889 Other general symptoms and signs: Secondary | ICD-10-CM | POA: Diagnosis not present

## 2016-03-14 ENCOUNTER — Ambulatory Visit: Payer: Federal, State, Local not specified - PPO

## 2016-03-21 ENCOUNTER — Ambulatory Visit: Payer: Federal, State, Local not specified - PPO | Admitting: Podiatry

## 2016-03-21 DIAGNOSIS — M722 Plantar fascial fibromatosis: Secondary | ICD-10-CM

## 2016-03-21 NOTE — Patient Instructions (Signed)

## 2016-03-24 NOTE — Progress Notes (Signed)
Patient presents for orthotic pick up.  Verbal and written break in and wear instructions given.  Patient will follow up in 4 weeks if symptoms worsen or fail to improve. 

## 2016-04-25 DIAGNOSIS — K080 Exfoliation of teeth due to systemic causes: Secondary | ICD-10-CM | POA: Diagnosis not present

## 2016-05-11 DIAGNOSIS — Z13220 Encounter for screening for lipoid disorders: Secondary | ICD-10-CM | POA: Diagnosis not present

## 2016-05-11 DIAGNOSIS — Z131 Encounter for screening for diabetes mellitus: Secondary | ICD-10-CM | POA: Diagnosis not present

## 2016-05-11 DIAGNOSIS — Z0000 Encounter for general adult medical examination without abnormal findings: Secondary | ICD-10-CM | POA: Diagnosis not present

## 2016-05-17 ENCOUNTER — Other Ambulatory Visit: Payer: Self-pay | Admitting: Family Medicine

## 2016-05-17 ENCOUNTER — Other Ambulatory Visit (HOSPITAL_COMMUNITY)
Admission: RE | Admit: 2016-05-17 | Discharge: 2016-05-17 | Disposition: A | Discharge status: Home / Self Care | Payer: Federal, State, Local not specified - PPO | Source: Ambulatory Visit | Attending: Family Medicine | Admitting: Family Medicine

## 2016-05-17 DIAGNOSIS — Z1151 Encounter for screening for human papillomavirus (HPV): Secondary | ICD-10-CM | POA: Diagnosis not present

## 2016-05-17 DIAGNOSIS — Z23 Encounter for immunization: Secondary | ICD-10-CM | POA: Diagnosis not present

## 2016-05-17 DIAGNOSIS — Z01411 Encounter for gynecological examination (general) (routine) with abnormal findings: Secondary | ICD-10-CM | POA: Diagnosis not present

## 2016-05-17 DIAGNOSIS — Z0000 Encounter for general adult medical examination without abnormal findings: Secondary | ICD-10-CM | POA: Diagnosis not present

## 2016-05-19 LAB — CYTOLOGY - PAP
Diagnosis: NEGATIVE
HPV (WINDOPATH): NOT DETECTED

## 2016-06-29 ENCOUNTER — Encounter: Payer: Self-pay | Admitting: Gastroenterology

## 2016-09-10 DIAGNOSIS — L309 Dermatitis, unspecified: Secondary | ICD-10-CM | POA: Diagnosis not present

## 2016-09-11 DIAGNOSIS — M940 Chondrocostal junction syndrome [Tietze]: Secondary | ICD-10-CM | POA: Diagnosis not present

## 2016-09-11 DIAGNOSIS — R0789 Other chest pain: Secondary | ICD-10-CM | POA: Diagnosis not present

## 2016-09-18 DIAGNOSIS — L669 Cicatricial alopecia, unspecified: Secondary | ICD-10-CM | POA: Diagnosis not present

## 2016-09-18 DIAGNOSIS — L658 Other specified nonscarring hair loss: Secondary | ICD-10-CM | POA: Diagnosis not present

## 2016-09-18 DIAGNOSIS — L259 Unspecified contact dermatitis, unspecified cause: Secondary | ICD-10-CM | POA: Diagnosis not present

## 2016-11-07 DIAGNOSIS — K080 Exfoliation of teeth due to systemic causes: Secondary | ICD-10-CM | POA: Diagnosis not present

## 2016-11-15 ENCOUNTER — Other Ambulatory Visit: Payer: Self-pay | Admitting: Family Medicine

## 2016-11-16 NOTE — Telephone Encounter (Signed)
Last seen 07/21/16-no appt sch.  Would you like to refill?

## 2016-11-16 NOTE — Telephone Encounter (Signed)
Please call pt. Needs appt in next 1-2 months for CPE, then can refill once to appt. Thanks.

## 2016-11-17 NOTE — Telephone Encounter (Signed)
I called patient to schedule CPE.  She states this was a mistake, she does not see Dr. Maudie Mercury any longer. She requests that I cancel this request. I did so.

## 2016-12-27 DIAGNOSIS — Z1231 Encounter for screening mammogram for malignant neoplasm of breast: Secondary | ICD-10-CM | POA: Diagnosis not present

## 2017-01-02 DIAGNOSIS — N951 Menopausal and female climacteric states: Secondary | ICD-10-CM | POA: Diagnosis not present

## 2017-01-30 ENCOUNTER — Encounter: Payer: Self-pay | Admitting: Podiatry

## 2017-01-30 ENCOUNTER — Ambulatory Visit: Payer: Federal, State, Local not specified - PPO | Admitting: Podiatry

## 2017-01-30 DIAGNOSIS — M722 Plantar fascial fibromatosis: Secondary | ICD-10-CM | POA: Diagnosis not present

## 2017-01-30 NOTE — Patient Instructions (Signed)
Dry your toes toward your nose when changing from a seated to a standing position With shoes on and the straight leg has small 1-3 minutes as needed throughout the day   Plantar Fasciitis Plantar fasciitis is a painful foot condition that affects the heel. It occurs when the band of tissue that connects the toes to the heel bone (plantar fascia) becomes irritated. This can happen after exercising too much or doing other repetitive activities (overuse injury). The pain from plantar fasciitis can range from mild irritation to severe pain that makes it difficult for you to walk or move. The pain is usually worse in the morning or after you have been sitting or lying down for a while. What are the causes? This condition may be caused by:  Standing for long periods of time.  Wearing shoes that do not fit.  Doing high-impact activities, including running, aerobics, and ballet.  Being overweight.  Having an abnormal way of walking (gait).  Having tight calf muscles.  Having high arches in your feet.  Starting a new athletic activity.  What are the signs or symptoms? The main symptom of this condition is heel pain. Other symptoms include:  Pain that gets worse after activity or exercise.  Pain that is worse in the morning or after resting.  Pain that goes away after you walk for a few minutes.  How is this diagnosed? This condition may be diagnosed based on your signs and symptoms. Your health care provider will also do a physical exam to check for:  A tender area on the bottom of your foot.  A high arch in your foot.  Pain when you move your foot.  Difficulty moving your foot.  You may also need to have imaging studies to confirm the diagnosis. These can include:  X-rays.  Ultrasound.  MRI.  How is this treated? Treatment for plantar fasciitis depends on the severity of the condition. Your treatment may include:  Rest, ice, and over-the-counter pain medicines to  manage your pain.  Exercises to stretch your calves and your plantar fascia.  A splint that holds your foot in a stretched, upward position while you sleep (night splint).  Physical therapy to relieve symptoms and prevent problems in the future.  Cortisone injections to relieve severe pain.  Extracorporeal shock wave therapy (ESWT) to stimulate damaged plantar fascia with electrical impulses. It is often used as a last resort before surgery.  Surgery, if other treatments have not worked after 12 months.  Follow these instructions at home:  Take medicines only as directed by your health care provider.  Avoid activities that cause pain.  Roll the bottom of your foot over a bag of ice or a bottle of cold water. Do this for 20 minutes, 3-4 times a day.  Perform simple stretches as directed by your health care provider.  Try wearing athletic shoes with air-sole or gel-sole cushions or soft shoe inserts.  Wear a night splint while sleeping, if directed by your health care provider.  Keep all follow-up appointments with your health care provider. How is this prevented?  Do not perform exercises or activities that cause heel pain.  Consider finding low-impact activities if you continue to have problems.  Lose weight if you need to. The best way to prevent plantar fasciitis is to avoid the activities that aggravate your plantar fascia. Contact a health care provider if:  Your symptoms do not go away after treatment with home care measures.  Your pain gets  worse.  Your pain affects your ability to move or do your daily activities. This information is not intended to replace advice given to you by your health care provider. Make sure you discuss any questions you have with your health care provider. Document Released: 11/08/2000 Document Revised: 07/19/2015 Document Reviewed: 12/24/2013 Elsevier Interactive Patient Education  [employer].

## 2017-01-30 NOTE — Progress Notes (Signed)
Patient ID: Erica Reilly, female   DOB: 1960/05/03, 56 y.o.   MRN: [id number]   Subjective: This patient presents today complaining of inferior right heel pain for the past 2 months. The symptoms occur intermittently on and off weightbearing with approximately 1-3 episodes today lasting for 5 minutes. This patient has ongoing bilateral inferior heel pain dating to 2005. In the past patient has worn custom orthotics is had corticosteroid injections in the past  Objective: Orientated 3 DP and PT pulses 2/4 bilaterally Reflex within normal limits bilaterally Sensation to 10 g monofilament wire intact 5/5 bilaterally Ankle reflexes reactive bilaterally No open skin lesions bilaterally Well-healed surgical scars fifth toes bilaterally Exquisite palpable tenderness medial plantar fascial insertional area right heel which duplicates area of discomfort, without any palpable lesions Mild palpable tenderness medial plantar insertional area left without any palpable lesions  Assessment: Exacerbation of plantar fasciitis right  Plan: Discuss treatment options with patient and offered patient Kenalog injection and she verbally consents Skin is prepped with alcohol and Betadine and 10 mg of Kenalog mixed with 10 mg of plain Xylocaine and 5 mg of plain Marcaine injected inferiorly he'll write for Kenalog injection #1. Patient tolerated procedure without any difficulty Shoeing and stretching discussed  Reappoint at patient's request

## 2017-03-08 DIAGNOSIS — M25562 Pain in left knee: Secondary | ICD-10-CM | POA: Diagnosis not present

## 2017-03-08 DIAGNOSIS — M25561 Pain in right knee: Secondary | ICD-10-CM | POA: Diagnosis not present

## 2017-03-08 DIAGNOSIS — M170 Bilateral primary osteoarthritis of knee: Secondary | ICD-10-CM | POA: Diagnosis not present

## 2017-03-08 DIAGNOSIS — G8929 Other chronic pain: Secondary | ICD-10-CM | POA: Diagnosis not present

## 2017-03-29 ENCOUNTER — Encounter: Payer: Self-pay | Admitting: Gastroenterology

## 2017-04-04 ENCOUNTER — Other Ambulatory Visit: Payer: Self-pay | Admitting: Orthopedic Surgery

## 2017-04-04 DIAGNOSIS — M25561 Pain in right knee: Secondary | ICD-10-CM

## 2017-04-10 ENCOUNTER — Encounter: Payer: Self-pay | Admitting: Sports Medicine

## 2017-04-10 ENCOUNTER — Ambulatory Visit: Payer: Federal, State, Local not specified - PPO | Admitting: Sports Medicine

## 2017-04-10 ENCOUNTER — Other Ambulatory Visit: Payer: Self-pay | Admitting: Sports Medicine

## 2017-04-10 DIAGNOSIS — M79671 Pain in right foot: Secondary | ICD-10-CM | POA: Diagnosis not present

## 2017-04-10 DIAGNOSIS — M722 Plantar fascial fibromatosis: Secondary | ICD-10-CM

## 2017-04-10 MED ORDER — METHYLPREDNISOLONE 4 MG PO TBPK: ORAL_TABLET | ORAL | 0 refills | Status: DC

## 2017-04-10 MED ORDER — DICLOFENAC SODIUM 75 MG PO TBEC: 75.0000 mg | DELAYED_RELEASE_TABLET | Freq: Two times a day (BID) | ORAL | 0 refills | Status: DC

## 2017-04-10 NOTE — Patient Instructions (Signed)

## 2017-04-11 ENCOUNTER — Encounter: Payer: Self-pay | Admitting: Sports Medicine

## 2017-04-11 NOTE — Progress Notes (Addendum)
Subjective: Erica Reilly is a 57 y.o. female patient presents to office with complaint of moderate heel pain on the right. Patient admits to post static dyskinesia for several months in duration. Patient has treated this problem with custom orthotics, stretching, and cortisone injection with the last one given back in December by Dr. Amalia Hailey with no additional help states that the injection helped most at 1-2 weeks.  Patient states pain hurts more in the morning and when her foot does hurt it is roughly about a 7-8 out of 10.  Patient wants to discuss other treatment options. Denies increased swelling or bruising to site, denies nausea vomiting fever chills night sweats or any other constitutional symptoms at this time, denies any acute trauma or any other pedal complaints.   Patient Active Problem List   Diagnosis Date Noted  . Spider veins of both lower extremities 01/07/2014  . LEUKOPENIA, CHRONIC 06/08/2009  . HOT FLASHES 09/14/2008  . HEADACHE 09/14/2008    Current Outpatient Medications on File Prior to Visit  Medication Sig Dispense Refill  . acyclovir (ZOVIRAX) 400 MG tablet TAKE 1 TABLET BY MOUTH THREE TIMES DAILY FOR 5 TO 7 DAYS AS NEEDED FOR OUTBREAK 21 tablet 0  . estradiol (EVAMIST) 1.53 MG/SPRAY transdermal spray Place 2 sprays onto the skin daily. 8.1 mL 11  . isometheptene-acetaminophen-dichloralphenazone (MIDRIN) 65-100-325 MG capsule Take 1 capsule by mouth 4 (four) times daily as needed for migraine. Maximum 5 capsules in 12 hours for migraine headaches, 8 capsules in 24 hours for tension headaches. 30 capsule 1  . progesterone (PROMETRIUM) 100 MG capsule Take 1 capsule (100 mg total) by mouth daily. 30 capsule 11   No current facility-administered medications on file prior to visit.     No Known Allergies  Objective: Physical Exam General: The patient is alert and oriented x3 in no acute distress.  Dermatology: Skin is warm, dry and supple bilateral lower extremities.  Nails 1-10 are normal.  Old surgical scars fifth toes bilateral.  There is no erythema, edema, no eccymosis, no open lesions present. Integument is otherwise unremarkable.  Vascular: Dorsalis Pedis pulse and Posterior Tibial pulse are 2/4 bilateral. Capillary fill time is immediate to all digits.  Neurological: Grossly intact to light touch with an achilles reflex of +2/5 and a  negative Tinel's sign bilateral.  Musculoskeletal: Tenderness to palpation at the medial calcaneal tubercale and through the insertion of the plantar fascia on the right foot. No pain with compression of calcaneus bilateral. No pain with tuning fork to calcaneus bilateral. No pain with calf compression bilateral. There is decreased Ankle joint range of motion bilateral. All other joints range of motion within normal limits bilateral.  Pes planus foot type.  Strength 5/5 in all groups bilateral.   Assessment and Plan: Problem List Items Addressed This Visit    None    Visit Diagnoses    Plantar fasciitis, right    -  Primary   Relevant Medications   methylPREDNISolone (MEDROL DOSEPAK) 4 MG TBPK tablet   diclofenac (VOLTAREN) 75 MG EC tablet   Pain of right heel          -Complete examination performed.  -Discussed with patient in detail the condition of plantar fasciitis, how this occurs and general treatment options. Explained both conservative and surgical treatments.  -Rx Diclofenac to start after Medrol dose pack is completed -Recommended good supportive shoes and advised use of orthotics - Explained in detail the use of the night splint for right  which was dispensed at today's visit. -Explained and dispensed to patient daily stretching exercises. -Recommend patient to ice affected area 1-2x daily. -Patient to return to office in 4 weeks for follow up or sooner if problems or questions arise.  If pain continues we will x-ray her right foot since there is no updated x-rays on file from previous treatment with  Dr. Amalia Hailey.  I also advised patient to consider other treatment options like physical therapy or shockwave therapy if pain continues to persist.  I gave patient educational brochure on shockwave therapy for her to review and to consider for future if pain continues.  Landis Martins, DPM

## 2017-04-19 DIAGNOSIS — Z01818 Encounter for other preprocedural examination: Secondary | ICD-10-CM | POA: Diagnosis not present

## 2017-04-19 DIAGNOSIS — Z8371 Family history of colonic polyps: Secondary | ICD-10-CM | POA: Diagnosis not present

## 2017-04-19 DIAGNOSIS — Z1211 Encounter for screening for malignant neoplasm of colon: Secondary | ICD-10-CM | POA: Diagnosis not present

## 2017-04-19 DIAGNOSIS — Z800 Family history of malignant neoplasm of digestive organs: Secondary | ICD-10-CM | POA: Diagnosis not present

## 2017-04-27 ENCOUNTER — Ambulatory Visit
Admission: RE | Admit: 2017-04-27 | Discharge: 2017-04-27 | Disposition: A | Discharge status: Home / Self Care | Payer: Federal, State, Local not specified - PPO | Source: Ambulatory Visit | Attending: Orthopedic Surgery | Admitting: Orthopedic Surgery

## 2017-04-27 DIAGNOSIS — M25561 Pain in right knee: Secondary | ICD-10-CM | POA: Diagnosis not present

## 2017-05-03 DIAGNOSIS — S83241D Other tear of medial meniscus, current injury, right knee, subsequent encounter: Secondary | ICD-10-CM | POA: Diagnosis not present

## 2017-05-14 DIAGNOSIS — J0100 Acute maxillary sinusitis, unspecified: Secondary | ICD-10-CM | POA: Diagnosis not present

## 2017-05-15 ENCOUNTER — Ambulatory Visit: Payer: Federal, State, Local not specified - PPO | Admitting: Sports Medicine

## 2017-05-15 ENCOUNTER — Encounter: Payer: Self-pay | Admitting: Sports Medicine

## 2017-05-15 DIAGNOSIS — M79671 Pain in right foot: Secondary | ICD-10-CM

## 2017-05-15 DIAGNOSIS — K080 Exfoliation of teeth due to systemic causes: Secondary | ICD-10-CM | POA: Diagnosis not present

## 2017-05-15 DIAGNOSIS — M722 Plantar fascial fibromatosis: Secondary | ICD-10-CM | POA: Diagnosis not present

## 2017-05-15 NOTE — Progress Notes (Signed)
Subjective: Erica Reilly is a 57 y.o. female patient returns to office for f/u of heel pain on the right. Patient reports that her pain is a little better after finishing her medications and after using night splint. Patient admits to walking around in her night splint and having pain. Denies swelling, warmth redness or any other pedal complaints.   Patient Active Problem List   Diagnosis Date Noted  . Spider veins of both lower extremities 01/07/2014  . LEUKOPENIA, CHRONIC 06/08/2009  . HOT FLASHES 09/14/2008  . HEADACHE 09/14/2008    Current Outpatient Medications on File Prior to Visit  Medication Sig Dispense Refill  . acyclovir (ZOVIRAX) 400 MG tablet TAKE 1 TABLET BY MOUTH THREE TIMES DAILY FOR 5 TO 7 DAYS AS NEEDED FOR OUTBREAK 21 tablet 0  . diclofenac (VOLTAREN) 75 MG EC tablet TAKE 1 TABLET(75 MG) BY MOUTH TWICE DAILY 30 tablet 0  . estradiol (EVAMIST) 1.53 MG/SPRAY transdermal spray Place 2 sprays onto the skin daily. 8.1 mL 11  . isometheptene-acetaminophen-dichloralphenazone (MIDRIN) 65-100-325 MG capsule Take 1 capsule by mouth 4 (four) times daily as needed for migraine. Maximum 5 capsules in 12 hours for migraine headaches, 8 capsules in 24 hours for tension headaches. 30 capsule 1  . methylPREDNISolone (MEDROL DOSEPAK) 4 MG TBPK tablet Take 1st as instructed 21 tablet 0  . progesterone (PROMETRIUM) 100 MG capsule Take 1 capsule (100 mg total) by mouth daily. 30 capsule 11   No current facility-administered medications on file prior to visit.     No Known Allergies  Objective: Physical Exam General: The patient is alert and oriented x3 in no acute distress.  Dermatology: Skin is warm, dry and supple bilateral lower extremities. Nails 1-10 are normal.  Old surgical scars fifth toes bilateral.  There is no erythema, edema, no eccymosis, no open lesions present. Integument is otherwise unremarkable.  Vascular: Dorsalis Pedis pulse and Posterior Tibial pulse are 2/4  bilateral. Capillary fill time is immediate to all digits.  Neurological: Grossly intact to light touch with an achilles reflex of +2/5 and a  negative Tinel's sign bilateral.  Musculoskeletal: Tenderness to palpation at the medial calcaneal tubercale and through the insertion of the plantar fascia on the right foot. No pain with compression of calcaneus bilateral. No pain with tuning fork to calcaneus bilateral. No pain with calf compression bilateral. There is decreased Ankle joint range of motion bilateral. All other joints range of motion within normal limits bilateral.  Pes planus foot type.  Strength 5/5 in all groups bilateral.   Assessment and Plan: Problem List Items Addressed This Visit    None    Visit Diagnoses    Plantar fasciitis, right    -  Primary   Pain of right heel          -Complete examination performed.  -Re-Discussed with patient in detail the condition of plantar fasciitis, how this occurs and general treatment options. Explained both conservative and surgical treatments.  -Rx [facility] at [facility] in office for treatment modalities for plantar fasciitis -Recommended good supportive shoes and advised use of orthotics -Continue with night splint and advised patient to refrain from walking around with it on. -Continue with daily stretching exercises. -Recommend patient to continue to ice affected area 1-2x daily. -Patient to return to office after physical therapy for follow up or sooner if problems or questions arise.   Landis Martins, DPM

## 2017-05-16 ENCOUNTER — Telehealth: Payer: Self-pay | Admitting: *Deleted

## 2017-05-16 DIAGNOSIS — M722 Plantar fascial fibromatosis: Secondary | ICD-10-CM

## 2017-05-16 NOTE — Telephone Encounter (Signed)
Hand carried referral to [facility] - In-office for PT right plantar fasciitis.

## 2017-05-16 NOTE — Telephone Encounter (Signed)
-----   Message from Landis Martins, [place] sent at 05/15/2017  1:36 PM EDT ----- Regarding: PT at [facility] in our office  R fasciitis

## 2017-05-18 DIAGNOSIS — Z5181 Encounter for therapeutic drug level monitoring: Secondary | ICD-10-CM | POA: Diagnosis not present

## 2017-05-18 DIAGNOSIS — E782 Mixed hyperlipidemia: Secondary | ICD-10-CM | POA: Diagnosis not present

## 2017-05-21 DIAGNOSIS — Z0000 Encounter for general adult medical examination without abnormal findings: Secondary | ICD-10-CM | POA: Diagnosis not present

## 2017-05-22 DIAGNOSIS — M79671 Pain in right foot: Secondary | ICD-10-CM | POA: Diagnosis not present

## 2017-05-22 DIAGNOSIS — R269 Unspecified abnormalities of gait and mobility: Secondary | ICD-10-CM | POA: Diagnosis not present

## 2017-05-22 DIAGNOSIS — M6281 Muscle weakness (generalized): Secondary | ICD-10-CM | POA: Diagnosis not present

## 2017-05-22 DIAGNOSIS — M25671 Stiffness of right ankle, not elsewhere classified: Secondary | ICD-10-CM | POA: Diagnosis not present

## 2017-05-24 ENCOUNTER — Encounter: Payer: Federal, State, Local not specified - PPO | Admitting: Gastroenterology

## 2017-05-24 DIAGNOSIS — Q438 Other specified congenital malformations of intestine: Secondary | ICD-10-CM | POA: Diagnosis not present

## 2017-05-24 DIAGNOSIS — Z800 Family history of malignant neoplasm of digestive organs: Secondary | ICD-10-CM | POA: Diagnosis not present

## 2017-05-24 DIAGNOSIS — K5730 Diverticulosis of large intestine without perforation or abscess without bleeding: Secondary | ICD-10-CM | POA: Diagnosis not present

## 2017-05-24 DIAGNOSIS — Z1211 Encounter for screening for malignant neoplasm of colon: Secondary | ICD-10-CM | POA: Diagnosis not present

## 2017-05-24 DIAGNOSIS — Z8371 Family history of colonic polyps: Secondary | ICD-10-CM | POA: Diagnosis not present

## 2017-05-25 ENCOUNTER — Other Ambulatory Visit: Payer: Self-pay | Admitting: Gastroenterology

## 2017-05-25 DIAGNOSIS — K639 Disease of intestine, unspecified: Secondary | ICD-10-CM

## 2017-05-29 DIAGNOSIS — R269 Unspecified abnormalities of gait and mobility: Secondary | ICD-10-CM | POA: Diagnosis not present

## 2017-05-29 DIAGNOSIS — M25671 Stiffness of right ankle, not elsewhere classified: Secondary | ICD-10-CM | POA: Diagnosis not present

## 2017-05-29 DIAGNOSIS — M6281 Muscle weakness (generalized): Secondary | ICD-10-CM | POA: Diagnosis not present

## 2017-05-29 DIAGNOSIS — M79671 Pain in right foot: Secondary | ICD-10-CM | POA: Diagnosis not present

## 2017-05-31 DIAGNOSIS — M79671 Pain in right foot: Secondary | ICD-10-CM | POA: Diagnosis not present

## 2017-05-31 DIAGNOSIS — R269 Unspecified abnormalities of gait and mobility: Secondary | ICD-10-CM | POA: Diagnosis not present

## 2017-05-31 DIAGNOSIS — M6281 Muscle weakness (generalized): Secondary | ICD-10-CM | POA: Diagnosis not present

## 2017-05-31 DIAGNOSIS — M25671 Stiffness of right ankle, not elsewhere classified: Secondary | ICD-10-CM | POA: Diagnosis not present

## 2017-06-05 DIAGNOSIS — M25671 Stiffness of right ankle, not elsewhere classified: Secondary | ICD-10-CM | POA: Diagnosis not present

## 2017-06-05 DIAGNOSIS — M6281 Muscle weakness (generalized): Secondary | ICD-10-CM | POA: Diagnosis not present

## 2017-06-05 DIAGNOSIS — R269 Unspecified abnormalities of gait and mobility: Secondary | ICD-10-CM | POA: Diagnosis not present

## 2017-06-05 DIAGNOSIS — M79671 Pain in right foot: Secondary | ICD-10-CM | POA: Diagnosis not present

## 2017-06-07 DIAGNOSIS — R269 Unspecified abnormalities of gait and mobility: Secondary | ICD-10-CM | POA: Diagnosis not present

## 2017-06-07 DIAGNOSIS — M6281 Muscle weakness (generalized): Secondary | ICD-10-CM | POA: Diagnosis not present

## 2017-06-07 DIAGNOSIS — M25671 Stiffness of right ankle, not elsewhere classified: Secondary | ICD-10-CM | POA: Diagnosis not present

## 2017-06-07 DIAGNOSIS — M79671 Pain in right foot: Secondary | ICD-10-CM | POA: Diagnosis not present

## 2017-06-12 DIAGNOSIS — M79671 Pain in right foot: Secondary | ICD-10-CM | POA: Diagnosis not present

## 2017-06-12 DIAGNOSIS — M25671 Stiffness of right ankle, not elsewhere classified: Secondary | ICD-10-CM | POA: Diagnosis not present

## 2017-06-12 DIAGNOSIS — M6281 Muscle weakness (generalized): Secondary | ICD-10-CM | POA: Diagnosis not present

## 2017-06-12 DIAGNOSIS — R269 Unspecified abnormalities of gait and mobility: Secondary | ICD-10-CM | POA: Diagnosis not present

## 2017-06-13 ENCOUNTER — Telehealth: Payer: Self-pay | Admitting: Sports Medicine

## 2017-06-13 DIAGNOSIS — M94261 Chondromalacia, right knee: Secondary | ICD-10-CM | POA: Diagnosis not present

## 2017-06-13 DIAGNOSIS — M1711 Unilateral primary osteoarthritis, right knee: Secondary | ICD-10-CM | POA: Diagnosis not present

## 2017-06-13 DIAGNOSIS — S83221A Peripheral tear of medial meniscus, current injury, right knee, initial encounter: Secondary | ICD-10-CM | POA: Diagnosis not present

## 2017-06-13 DIAGNOSIS — M659 Synovitis and tenosynovitis, unspecified: Secondary | ICD-10-CM | POA: Diagnosis not present

## 2017-06-13 DIAGNOSIS — X58XXXA Exposure to other specified factors, initial encounter: Secondary | ICD-10-CM | POA: Diagnosis not present

## 2017-06-13 DIAGNOSIS — Y999 Unspecified external cause status: Secondary | ICD-10-CM | POA: Diagnosis not present

## 2017-06-13 DIAGNOSIS — M6751 Plica syndrome, right knee: Secondary | ICD-10-CM | POA: Diagnosis not present

## 2017-06-13 DIAGNOSIS — M722 Plantar fascial fibromatosis: Secondary | ICD-10-CM | POA: Diagnosis not present

## 2017-06-13 DIAGNOSIS — S83231A Complex tear of medial meniscus, current injury, right knee, initial encounter: Secondary | ICD-10-CM | POA: Diagnosis not present

## 2017-06-13 DIAGNOSIS — G8918 Other acute postprocedural pain: Secondary | ICD-10-CM | POA: Diagnosis not present

## 2017-06-13 NOTE — Telephone Encounter (Signed)
Pt called back and would like to go ahead and proceed with ordering a new pair of orthotics and it be billed to insurance.

## 2017-06-19 DIAGNOSIS — R269 Unspecified abnormalities of gait and mobility: Secondary | ICD-10-CM | POA: Diagnosis not present

## 2017-06-19 DIAGNOSIS — Z9889 Other specified postprocedural states: Secondary | ICD-10-CM | POA: Diagnosis not present

## 2017-06-19 DIAGNOSIS — M25671 Stiffness of right ankle, not elsewhere classified: Secondary | ICD-10-CM | POA: Diagnosis not present

## 2017-06-19 DIAGNOSIS — M6281 Muscle weakness (generalized): Secondary | ICD-10-CM | POA: Diagnosis not present

## 2017-06-19 DIAGNOSIS — M79671 Pain in right foot: Secondary | ICD-10-CM | POA: Diagnosis not present

## 2017-06-19 DIAGNOSIS — S83241D Other tear of medial meniscus, current injury, right knee, subsequent encounter: Secondary | ICD-10-CM | POA: Diagnosis not present

## 2017-06-19 DIAGNOSIS — R262 Difficulty in walking, not elsewhere classified: Secondary | ICD-10-CM | POA: Diagnosis not present

## 2017-06-19 DIAGNOSIS — M25661 Stiffness of right knee, not elsewhere classified: Secondary | ICD-10-CM | POA: Diagnosis not present

## 2017-06-21 DIAGNOSIS — M25671 Stiffness of right ankle, not elsewhere classified: Secondary | ICD-10-CM | POA: Diagnosis not present

## 2017-06-21 DIAGNOSIS — R269 Unspecified abnormalities of gait and mobility: Secondary | ICD-10-CM | POA: Diagnosis not present

## 2017-06-21 DIAGNOSIS — M79671 Pain in right foot: Secondary | ICD-10-CM | POA: Diagnosis not present

## 2017-06-21 DIAGNOSIS — M6281 Muscle weakness (generalized): Secondary | ICD-10-CM | POA: Diagnosis not present

## 2017-06-26 DIAGNOSIS — M79671 Pain in right foot: Secondary | ICD-10-CM | POA: Diagnosis not present

## 2017-06-26 DIAGNOSIS — R269 Unspecified abnormalities of gait and mobility: Secondary | ICD-10-CM | POA: Diagnosis not present

## 2017-06-26 DIAGNOSIS — M6281 Muscle weakness (generalized): Secondary | ICD-10-CM | POA: Diagnosis not present

## 2017-06-26 DIAGNOSIS — M25671 Stiffness of right ankle, not elsewhere classified: Secondary | ICD-10-CM | POA: Diagnosis not present

## 2017-07-03 DIAGNOSIS — M79671 Pain in right foot: Secondary | ICD-10-CM | POA: Diagnosis not present

## 2017-07-03 DIAGNOSIS — M6281 Muscle weakness (generalized): Secondary | ICD-10-CM | POA: Diagnosis not present

## 2017-07-03 DIAGNOSIS — M25671 Stiffness of right ankle, not elsewhere classified: Secondary | ICD-10-CM | POA: Diagnosis not present

## 2017-07-03 DIAGNOSIS — R269 Unspecified abnormalities of gait and mobility: Secondary | ICD-10-CM | POA: Diagnosis not present

## 2017-07-12 DIAGNOSIS — R262 Difficulty in walking, not elsewhere classified: Secondary | ICD-10-CM | POA: Diagnosis not present

## 2017-07-12 DIAGNOSIS — Z9889 Other specified postprocedural states: Secondary | ICD-10-CM | POA: Diagnosis not present

## 2017-07-12 DIAGNOSIS — M25461 Effusion, right knee: Secondary | ICD-10-CM | POA: Diagnosis not present

## 2017-07-12 DIAGNOSIS — M25661 Stiffness of right knee, not elsewhere classified: Secondary | ICD-10-CM | POA: Diagnosis not present

## 2017-07-18 ENCOUNTER — Ambulatory Visit
Admission: RE | Admit: 2017-07-18 | Discharge: 2017-07-18 | Disposition: A | Discharge status: Home / Self Care | Payer: Federal, State, Local not specified - PPO | Source: Ambulatory Visit | Attending: Gastroenterology | Admitting: Gastroenterology

## 2017-07-18 DIAGNOSIS — K639 Disease of intestine, unspecified: Secondary | ICD-10-CM

## 2017-07-18 DIAGNOSIS — N83201 Unspecified ovarian cyst, right side: Secondary | ICD-10-CM | POA: Diagnosis not present

## 2017-07-18 MED ORDER — IOPAMIDOL (ISOVUE-300) INJECTION 61%
100.0000 mL | Freq: Once | INTRAVENOUS | Status: AC | PRN
  Administered 2017-07-18: 100 mL via INTRAVENOUS

## 2017-07-25 DIAGNOSIS — S83241D Other tear of medial meniscus, current injury, right knee, subsequent encounter: Secondary | ICD-10-CM | POA: Diagnosis not present

## 2017-07-25 DIAGNOSIS — R29898 Other symptoms and signs involving the musculoskeletal system: Secondary | ICD-10-CM | POA: Diagnosis not present

## 2017-07-25 DIAGNOSIS — M25661 Stiffness of right knee, not elsewhere classified: Secondary | ICD-10-CM | POA: Diagnosis not present

## 2017-07-25 DIAGNOSIS — M25461 Effusion, right knee: Secondary | ICD-10-CM | POA: Diagnosis not present

## 2017-08-27 DIAGNOSIS — N83201 Unspecified ovarian cyst, right side: Secondary | ICD-10-CM | POA: Diagnosis not present

## 2017-08-27 DIAGNOSIS — N941 Unspecified dyspareunia: Secondary | ICD-10-CM | POA: Diagnosis not present

## 2017-08-29 DIAGNOSIS — N83201 Unspecified ovarian cyst, right side: Secondary | ICD-10-CM | POA: Diagnosis not present

## 2017-08-29 DIAGNOSIS — N951 Menopausal and female climacteric states: Secondary | ICD-10-CM | POA: Diagnosis not present

## 2017-09-24 DIAGNOSIS — L658 Other specified nonscarring hair loss: Secondary | ICD-10-CM | POA: Diagnosis not present

## 2017-09-24 DIAGNOSIS — L669 Cicatricial alopecia, unspecified: Secondary | ICD-10-CM | POA: Diagnosis not present

## 2017-09-25 DIAGNOSIS — Z4789 Encounter for other orthopedic aftercare: Secondary | ICD-10-CM | POA: Diagnosis not present

## 2017-11-20 DIAGNOSIS — K080 Exfoliation of teeth due to systemic causes: Secondary | ICD-10-CM | POA: Diagnosis not present

## 2017-11-21 DIAGNOSIS — N83201 Unspecified ovarian cyst, right side: Secondary | ICD-10-CM | POA: Diagnosis not present

## 2017-11-21 DIAGNOSIS — N951 Menopausal and female climacteric states: Secondary | ICD-10-CM | POA: Diagnosis not present

## 2017-11-21 DIAGNOSIS — Z23 Encounter for immunization: Secondary | ICD-10-CM | POA: Diagnosis not present

## 2017-11-23 ENCOUNTER — Telehealth: Payer: Self-pay

## 2017-11-23 ENCOUNTER — Telehealth: Payer: Self-pay | Admitting: *Deleted

## 2017-11-23 NOTE — Telephone Encounter (Signed)
Patient called back and was given the appt  

## 2017-11-23 NOTE — Telephone Encounter (Signed)
Per Mechele Claude at [facility] - was following up on appt for pt- referred to our office.  Appt given of 10/7 at 11am.  Tried to reach pt at home and cell number - no answer- left a VM to call office.

## 2017-11-27 NOTE — Progress Notes (Signed)
Updated med list from pt's [place] office notes.

## 2017-11-28 DIAGNOSIS — L239 Allergic contact dermatitis, unspecified cause: Secondary | ICD-10-CM | POA: Diagnosis not present

## 2017-11-29 NOTE — Progress Notes (Signed)
[facility]in[facility] at [facility] Note: New Patient FIRST VISIT   Consult was requested by Dr. Kendall Flack for an ovarian cyst  Chief Complaint  Patient presents with  . Cyst of ovary, unspecified laterality    HPI: Ms. Erica Reilly  is a very nice 57 y.o.  P0  She went for routine colonoscopy [id number] (father had a h/o colon cancer) and there was mass effect per the GI. Thus a CTScan was ordered. CT 06/2017 showing a 6cm right simple ovarian cyst. She was referred to her Gyn.  She is on topical estrogen and oral progesterone for HRT. She tried to stop this some time back and had severe symptoms.  08/2017 US showed the right ovary 6.5cm with a simple cyst 6 x 4.15 x 5.4cm Followup 10/2017 simple cyst 6.25 x 4.16 x 5.6 cm (slightly larger). No free fluid, left ovary WNL, Uterus normal EM with fibroid.  CA125 08/2017 = 5  The patient was counseled on observation versus USO/bilateral salpingectomy.  She is seeking a Gyn Oncologic opinion before making her decision.  She does note occasional dysparunea and some urinary frequency. She is concerned losing her tubes may affect her hormone function. She is concerned losing one of her ovaries may lead to her needing more hormones to manage her menopausal symptoms. She denies bloating, appetite change, weight change, bowel movement changes.  Imported EPIC Oncologic History:   No history exists.    Measurement of disease: 08/2017 CA125 = 5  Radiology: . No results found.  . 07/18/17 - CTAP -  Lower chest: The lung bases are clear of acute process. No pleural effusion or pulmonary lesions. The heart is normal in size. No pericardial effusion. The distal esophagus and aorta are unremarkable.  Hepatobiliary: No focal hepatic lesions or intrahepatic biliary dilatation. A small simple right hepatic lobe cyst is noted. The gallbladder is normal. No common bile duct dilatation.  Pancreas: No mass,  inflammation or ductal dilatation.  Spleen: Normal size.  No focal lesions.  Adrenals/Urinary Tract: The adrenal glands and kidneys are unremarkable. The bladder is normal. Stomach/Bowel: The stomach, duodenum, small bowel and colon are unremarkable. No acute inflammatory process, mass lesions or obstructive findings. The terminal ileum is normal. The appendix is normal. I do not see a cecal or appendiceal mass or extrinsic mass in the right lower quadrant to account for the colonoscopy findings. Vascular/Lymphatic: The aorta is normal in caliber. No dissection.  The branch vessels are patent. The major venous structures are patent. No mesenteric or retroperitoneal mass or adenopathy. Small scattered lymph nodes are noted.  Reproductive: The uterus is retroverted. There is a 6 cm cyst associated with the right ovary which appears simple. The left ovary is normal. Other: No pelvic mass or adenopathy. No free pelvic fluid collections. No inguinal mass or adenopathy. No abdominal wall hernia or subcutaneous lesions.  Musculoskeletal: No significant bony findings. Moderate degenerate disc disease at L5-S1 and moderate facet disease at L4-5. IMPRESSION: 1. No cecal mass, appendiceal mass or extrinsic right lower quadrant mass. 2. 6 cm simple appearing right ovarian cyst. Given the patient's age and the size of the lesion, I would recommend follow-up ultrasound examination in 3-4 months. 3. No acute abdominal/pelvic findings or lymphadenopathy. . 08/2017 and 10/2017 Ultrasounds as noted in HPI  Outpatient Encounter Medications as of 12/03/2017  Medication Sig  . BIOTIN PO biotin  . desonide (DESOWEN) 0.05 % ointment desonide 0.05 % topical  ointment  . estradiol (EVAMIST) 1.53 MG/SPRAY transdermal spray Place 2 sprays onto the skin daily.  . finasteride (PROSCAR) 5 MG tablet 2.5 mg daily  . glucosamine-chondroitin 500-400 MG tablet Take 1 tablet by mouth 3 (three) times daily.  Marland Kitchen  isometheptene-acetaminophen-dichloralphenazone (MIDRIN) 65-100-325 MG capsule Take 1 capsule by mouth 4 (four) times daily as needed for migraine. Maximum 5 capsules in 12 hours for migraine headaches, 8 capsules in 24 hours for tension headaches.  . methylPREDNISolone (MEDROL DOSEPAK) 4 MG TBPK tablet Take 1st as instructed  . Multiple Vitamin (MULTIVITAMIN) capsule Take 1 capsule by mouth daily.  . Omega-3 Fatty Acids ([employer] FISH OIL PO) Fish Oil  . progesterone (PROMETRIUM) 100 MG capsule Take 1 capsule (100 mg total) by mouth daily.  Marland Kitchen acyclovir (ZOVIRAX) 400 MG tablet TAKE 1 TABLET BY MOUTH THREE TIMES DAILY FOR 5 TO 7 DAYS AS NEEDED FOR OUTBREAK (Patient not taking: "as needed")  . [DISCONTINUED] bisacodyl (DULCOLAX) 5 MG EC tablet Take 5 mg by mouth daily as needed for moderate constipation.  . [DISCONTINUED] diclofenac (VOLTAREN) 75 MG EC tablet TAKE 1 TABLET(75 MG) BY MOUTH TWICE DAILY   No facility-administered encounter medications on file as of 12/03/2017.    No Known Allergies  Past Medical History:  Diagnosis Date  . Allergic rhinitis, mild   . LEUKOPENIA, CHRONIC    2019 - patient unsure of this diagnosis  . Migraine headache    Past Surgical History:  Procedure Laterality Date  . COLONOSCOPY     2003 2008  . KNEE ARTHROSCOPY Right         Past Gynecological History:   GYNECOLOGIC HISTORY:  . No LMP recorded. Patient is postmenopausal. 23 . Menarche: 57 years old . P 0 . Contraceptive h/o OCP . HRT Yes Rx by Ob/Gyn  . Last Pap 2018 neg with neg HRHPV Family Hx:  Family History  Problem Relation Age of Onset  . Colon cancer Father   . Brain cancer Father   . Prostate cancer Father   . Diabetes Mother   . Arthritis Mother   . Hypertension Mother   . Hypertension Sister   . Heart murmur Brother    Social Hx:  Marland Kitchen Tobacco use: none . Alcohol use: holidays . Illicit Drug use: none . Illicit IV Drug use: none    Review of Systems: Review of Systems   Genitourinary: Positive for dyspareunia and frequency.   All other systems reviewed and are negative.  Vitals:  Vitals:   12/03/17 1125  Weight: 154 lb 4.8 oz (70 kg)  Height: 5' 4.5" (1.638 m)    Vitals:   12/03/17 1125  BP: 125/66  Pulse: 65  Resp: 18  Temp: 98.1 F (36.7 C)  SpO2: 100%   Body mass index is 26.08 kg/m.  Physical Exam: General :  Well developed, 57 y.o., female in no apparent distress HEENT:  Normocephalic/atraumatic, symmetric, EOMI, eyelids normal Neck:   Supple, no masses.  Lymphatics:  No cervical/ submandibular/ supraclavicular/ infraclavicular/ inguinal adenopathy Respiratory:  Respirations unlabored, no use of accessory muscles CV:   Deferred Breast:  Deferred Musculoskeletal: No CVA tenderness, normal muscle strength. Abdomen:  Soft, non-tender and nondistended. No evidence of hernia. No masses. Extremities:  No lymphedema, no erythema, non-tender. Skin:   Normal inspection Neuro/Psych:  No focal motor deficit, no abnormal mental status. Normal gait. Normal affect. Alert and oriented to person, place, and time  Genito Urinary: Deferred as patient was seeking second opinion only.  Assessment  Ovarian cyst ECOG PERFORMANCE STATUS: 1 - Symptomatic but completely ambulatory  Plan  1. Data reviewed ? I independently reviewed the images and the radiology reports from her referring doctor's office and discussed my interpretation in the presence of the patient and her husband today. ? The cyst is simple ? I reviewed her referring doctor's office notes and I have summarized in the HPI ? History was obtained from the patient and the chart ? We reviewed her normal tumor marker 2. Management ? Options include observation versus surgery ? If surgery pursued I agree with Dr. Haygood, I would perform unilateral oophorectomy and offer bilateral salpingectomy ? She is likely more symptomatic than she thinks. I suspect the urinary frequency would improve  with surgery. ? If observation chosen then rescan at 6 months and again at 1 year from current. (ie 2 scans 6 months apart) ? Could repeat CA125, although if unchanged simple cyst I do not see need to repeat. ? One option would be a ROMA, that could provide additional reassurance ? Torsion risks were discussed. 3. She asked me about the cyst turning into cancer and I told her we don't typically think of simple ovarian cysts doing this. 4. She asked me about the bilateral salpingectomy recommendation ? I explained if for other GYN indications surgery is performed and an ovary will be left then most GYN providers would offer a bilateral tubal removal based on the data from [place] in BRCA patients showing precancerous cells in some tubes and the thought that ovarian cancer may actually start in the tubal epithelium. 5. She asked me about how removing one ovary would affect her hormone production and need for additional hormonal therapy. o I defer to Dr. Haygood, but I told the patient do not know of any evidence removing one of ovary would increase her hormonal therapy needs. i. If anything her age and time is going to determine that effect. 6. Overall recommendations as follows: ? Observation  ? Repeat TVUS Q 6 months x 1 year, then could go to annual if desire. ? Consider adding ROMA (HE4 + CA125 to increase sensitivity of tumor marker testing) ? Although my preference is CA125 alone and Dr. Haygood has done this; in a case such as hers it would not be unreasonable to add this ? Surgery if: ? worsening symptoms then recommend surgery (USO + Bilateral tubes) ? ROMA shows abnormal postmenopausal levels ? Simple cyst reaches 10cm or develops complexity   Erica B Phelps, MD  12/03/2017, 6:13 PM    Cc: Erica Haygood, MD (Referring Ob/Gyn) Webb, Carol, MD (PCP)  

## 2017-12-03 ENCOUNTER — Encounter: Payer: Self-pay | Admitting: Obstetrics

## 2017-12-03 ENCOUNTER — Inpatient Hospital Stay: Payer: Federal, State, Local not specified - PPO | Attending: Obstetrics | Admitting: Obstetrics

## 2017-12-03 VITALS — BP 125/66 | HR 65 | Temp 98.1°F | Resp 18 | Ht 64.5 in | Wt 154.3 lb

## 2017-12-03 DIAGNOSIS — N83209 Unspecified ovarian cyst, unspecified side: Secondary | ICD-10-CM

## 2017-12-03 DIAGNOSIS — N83201 Unspecified ovarian cyst, right side: Secondary | ICD-10-CM | POA: Insufficient documentation

## 2017-12-03 NOTE — Patient Instructions (Signed)
No further follow up needed at this time.  Plan to follow up with your gynecologist.  Please call for any needs or concerns.

## 2018-01-03 DIAGNOSIS — Z803 Family history of malignant neoplasm of breast: Secondary | ICD-10-CM | POA: Diagnosis not present

## 2018-01-03 DIAGNOSIS — Z1231 Encounter for screening mammogram for malignant neoplasm of breast: Secondary | ICD-10-CM | POA: Diagnosis not present

## 2018-04-05 DIAGNOSIS — G8929 Other chronic pain: Secondary | ICD-10-CM | POA: Diagnosis not present

## 2018-04-05 DIAGNOSIS — M25562 Pain in left knee: Secondary | ICD-10-CM | POA: Diagnosis not present

## 2018-04-12 ENCOUNTER — Other Ambulatory Visit: Payer: Self-pay | Admitting: Orthopedic Surgery

## 2018-04-12 DIAGNOSIS — M25562 Pain in left knee: Secondary | ICD-10-CM

## 2018-04-17 ENCOUNTER — Other Ambulatory Visit: Payer: Federal, State, Local not specified - PPO

## 2018-04-19 ENCOUNTER — Other Ambulatory Visit: Payer: Federal, State, Local not specified - PPO

## 2018-04-20 ENCOUNTER — Ambulatory Visit
Admission: RE | Admit: 2018-04-20 | Discharge: 2018-04-20 | Disposition: A | Discharge status: Home / Self Care | Payer: Federal, State, Local not specified - PPO | Source: Ambulatory Visit | Attending: Orthopedic Surgery | Admitting: Orthopedic Surgery

## 2018-04-20 DIAGNOSIS — M25562 Pain in left knee: Secondary | ICD-10-CM

## 2018-04-24 DIAGNOSIS — G8929 Other chronic pain: Secondary | ICD-10-CM | POA: Diagnosis not present

## 2018-04-24 DIAGNOSIS — M25562 Pain in left knee: Secondary | ICD-10-CM | POA: Diagnosis not present

## 2018-08-26 DIAGNOSIS — K080 Exfoliation of teeth due to systemic causes: Secondary | ICD-10-CM | POA: Diagnosis not present

## 2018-09-05 DIAGNOSIS — N83201 Unspecified ovarian cyst, right side: Secondary | ICD-10-CM | POA: Diagnosis not present

## 2018-09-06 DIAGNOSIS — Z5181 Encounter for therapeutic drug level monitoring: Secondary | ICD-10-CM | POA: Diagnosis not present

## 2018-09-06 DIAGNOSIS — E782 Mixed hyperlipidemia: Secondary | ICD-10-CM | POA: Diagnosis not present

## 2018-09-10 DIAGNOSIS — Z0000 Encounter for general adult medical examination without abnormal findings: Secondary | ICD-10-CM | POA: Diagnosis not present

## 2018-10-07 DIAGNOSIS — L658 Other specified nonscarring hair loss: Secondary | ICD-10-CM | POA: Diagnosis not present

## 2018-10-07 DIAGNOSIS — L669 Cicatricial alopecia, unspecified: Secondary | ICD-10-CM | POA: Diagnosis not present

## 2018-11-14 DIAGNOSIS — L237 Allergic contact dermatitis due to plants, except food: Secondary | ICD-10-CM | POA: Diagnosis not present

## 2019-01-03 DIAGNOSIS — F4320 Adjustment disorder, unspecified: Secondary | ICD-10-CM | POA: Diagnosis not present

## 2019-01-06 DIAGNOSIS — Z1231 Encounter for screening mammogram for malignant neoplasm of breast: Secondary | ICD-10-CM | POA: Diagnosis not present

## 2019-01-06 DIAGNOSIS — Z803 Family history of malignant neoplasm of breast: Secondary | ICD-10-CM | POA: Diagnosis not present

## 2019-01-10 DIAGNOSIS — F4320 Adjustment disorder, unspecified: Secondary | ICD-10-CM | POA: Diagnosis not present

## 2019-01-20 DIAGNOSIS — F4320 Adjustment disorder, unspecified: Secondary | ICD-10-CM | POA: Diagnosis not present

## 2019-01-24 DIAGNOSIS — F4320 Adjustment disorder, unspecified: Secondary | ICD-10-CM | POA: Diagnosis not present

## 2019-02-07 DIAGNOSIS — F4320 Adjustment disorder, unspecified: Secondary | ICD-10-CM | POA: Diagnosis not present

## 2019-02-07 DIAGNOSIS — N6489 Other specified disorders of breast: Secondary | ICD-10-CM | POA: Diagnosis not present

## 2019-02-07 DIAGNOSIS — R922 Inconclusive mammogram: Secondary | ICD-10-CM | POA: Diagnosis not present

## 2019-03-07 DIAGNOSIS — F4320 Adjustment disorder, unspecified: Secondary | ICD-10-CM | POA: Diagnosis not present

## 2019-03-07 DIAGNOSIS — N83201 Unspecified ovarian cyst, right side: Secondary | ICD-10-CM | POA: Diagnosis not present

## 2019-03-13 ENCOUNTER — Ambulatory Visit: Payer: Federal, State, Local not specified - PPO | Attending: Internal Medicine

## 2019-03-13 ENCOUNTER — Other Ambulatory Visit: Payer: Self-pay

## 2019-03-13 DIAGNOSIS — Z20822 Contact with and (suspected) exposure to covid-19: Secondary | ICD-10-CM | POA: Diagnosis not present

## 2019-03-14 LAB — NOVEL CORONAVIRUS, NAA: SARS-CoV-2, NAA: NOT DETECTED

## 2019-03-21 DIAGNOSIS — F4320 Adjustment disorder, unspecified: Secondary | ICD-10-CM | POA: Diagnosis not present

## 2019-04-11 DIAGNOSIS — F418 Other specified anxiety disorders: Secondary | ICD-10-CM | POA: Diagnosis not present

## 2019-04-25 DIAGNOSIS — F418 Other specified anxiety disorders: Secondary | ICD-10-CM | POA: Diagnosis not present

## 2019-05-26 ENCOUNTER — Other Ambulatory Visit: Payer: Self-pay

## 2019-05-26 DIAGNOSIS — N950 Postmenopausal bleeding: Secondary | ICD-10-CM | POA: Diagnosis not present

## 2019-07-16 DIAGNOSIS — L255 Unspecified contact dermatitis due to plants, except food: Secondary | ICD-10-CM | POA: Diagnosis not present

## 2019-08-20 DIAGNOSIS — Z01419 Encounter for gynecological examination (general) (routine) without abnormal findings: Secondary | ICD-10-CM | POA: Diagnosis not present

## 2019-08-26 DIAGNOSIS — N83201 Unspecified ovarian cyst, right side: Secondary | ICD-10-CM | POA: Diagnosis not present

## 2019-08-29 DIAGNOSIS — N951 Menopausal and female climacteric states: Secondary | ICD-10-CM | POA: Diagnosis not present

## 2019-08-29 DIAGNOSIS — Z124 Encounter for screening for malignant neoplasm of cervix: Secondary | ICD-10-CM | POA: Diagnosis not present

## 2019-08-29 DIAGNOSIS — Z01419 Encounter for gynecological examination (general) (routine) without abnormal findings: Secondary | ICD-10-CM | POA: Diagnosis not present

## 2019-08-29 DIAGNOSIS — Z6825 Body mass index (BMI) 25.0-25.9, adult: Secondary | ICD-10-CM | POA: Diagnosis not present

## 2019-08-29 DIAGNOSIS — N83201 Unspecified ovarian cyst, right side: Secondary | ICD-10-CM | POA: Diagnosis not present

## 2019-09-24 DIAGNOSIS — N83201 Unspecified ovarian cyst, right side: Secondary | ICD-10-CM | POA: Diagnosis not present

## 2019-10-06 ENCOUNTER — Other Ambulatory Visit: Payer: Self-pay | Admitting: Obstetrics and Gynecology

## 2019-10-13 DIAGNOSIS — L669 Cicatricial alopecia, unspecified: Secondary | ICD-10-CM | POA: Diagnosis not present

## 2019-10-13 DIAGNOSIS — L658 Other specified nonscarring hair loss: Secondary | ICD-10-CM | POA: Diagnosis not present

## 2019-11-24 DIAGNOSIS — R102 Pelvic and perineal pain: Secondary | ICD-10-CM | POA: Diagnosis not present

## 2019-11-24 DIAGNOSIS — N83201 Unspecified ovarian cyst, right side: Secondary | ICD-10-CM | POA: Diagnosis not present

## 2019-11-24 NOTE — H&P (Signed)
Erica Reilly is a 59 YO female, P: 0-0-3-0 who  presents for removal of both fallopian tubes and ovaries  due to a persistent right ovarian cyst. In 2019 the patient had a CT scan, for a GI concern at which time a simple appearing  6 cm right ovarian cyst was noted. Follow up ultrasounds yearly have demonstrated relative stability of the cyst and confirmed that the cyst was simple.  The patient was seen by a Gyn Oncologist-Dr. Gerarda Fraction in 2019 for recommendations on cyst management however no surgical or specific surveillance plan was advised. In light of this, the patient chose to monitor her cyst yearly.  During this time she denies having any pelvic pain, dyspareunia, vaginitis symptoms, changes in bowel or bladder function or fever.  Earlier this year however, she did have some post menopausal spotting.  A pelvic ultrasound showed her endometrial lining to measure  4.5 mm and her right ovarian cyst  6.5 cm.  Her endometrial biopsy was benign.  The patient remained asymptomatic with her ovarian cyst though in the past month she reported  some intermittent pelvic discomfort, primarily on the right side.  Pelvic ultrasound at pre-op visit showed an anteverted uterus: (fundus to external os 7 cm,  25.41 mL, ) : 3.68 x 3.11 x 4.26 cm;  endometrium: 3 mm;  left ovary-1.93 cm and right ovary-7.06 cm with a  6.5 cm cyst and  normal A-V waveforms. Stable fibroid (anterior right intramural- 1.28 cm.).  Given the persistent nature of this ovarian cyst and her post menopausal status,  the patient has decided to proceed with removal of both tubes and ovaries.    Past Medical History  OB History: G: 3; P: 0-0-3-0  GYN History: menarche:59 YO;    LMP : age 44;   Denies history of abnormal PAP smear;   Last PAP smear: 2021-normal with negative HPV  Medical History: Migraine, Leukopenia and Arthritis  Surgical History: 1977 Tonsillectomy;  and 2012 Bilateral Foot Surgery Denies problems with anesthesia or history of  blood transfusions  Family History: Diabetes Mellitus, Hypertension, Osteoporosis, Colon, Brain, Breast and Prostate Cancer; Arthritis and Dementia  Social History:   Married and Retired; Denies Tobacco use and Occasionally uses alcohol   Medications: Ibuprofen 200 mg as directed prn Tylenol 500 mg as directed prn Finasteride 5 mg daily Sumatriptan 25 mg 1 tablet po bid prn Progesterone 100 mg po qhs Evamist 2 sprays to forearm daily as directed Acyclovir 400 mg  tid prn cold sores Biotin daily Fish Oil daily Glucosamine-Chondrotin daily Multivitamin daily Probiotic daily    No Known Allergies   Denies sensitivity to peanuts, shellfish, soy, latex or adhesives.   ROS: Admits to glasses/contact lenses, occasional migraine headaches and occasional tinnitus but denies vision changes, nasal congestion, dysphagia,  dizziness, hoarseness, cough,  chest pain, shortness of breath, nausea, vomiting, diarrhea,constipation,  urinary frequency, urgency  dysuria, hematuria, vaginitis symptoms, swelling of joints,easy bruising,  myalgias, arthralgias, skin rashes, unexplained weight loss and except as is mentioned in the history of present illness, patient's review of systems is otherwise negative.     Physical Exam  Bp: 114/62; P: 88 bpm: R: 16;  Weight: 150 lbs;  Height: 5\' 5" ;  BMI: 25;  O2Sat.: 98% (room air); Temperature: 97.3%  Neck: supple without masses or thyromegaly Lungs: clear to auscultation Heart: regular rate and rhythm Abdomen: soft, non-tender and no organomegaly Pelvic:EGBUS- wnl; vagina-normal rugae; uterus-normal size, cervix without lesions or motion tenderness; adnexae-mild right adnexal  tenderness but no palpable  masses on either side. Extremities:  no clubbing, cyanosis or edema   Assesment: Persistent Right Ovarian Cyst                      Pelvic Pain   Disposition:  The patient was given the indication for her procedure(s) along with the risks, which  include but are not limited to, reaction to anesthesia, damage to adjacent organs, Infection, excessive bleeding, formation of scar tissue, and the possible need for an open abdominal incision. She was further advised that she will experience transient post operative facial edema, that her hospital stay is expected to be 0 days, she should be able to return to her usual activities within 1-2 weeks and  intercourse to be delayed until after post operative visit.  She was further informed hat the robotic approach to her surgery requires more time to perform than an open abdominal approach. She verbalized understanding of these risks and has consented to proceed with a Robot Assisted Bilateral  Salpingo[facility]oophorectomy at [facility] on December 09, 2019 at 12: 45 p.m.  CSN# [id number]   Damani Rando J. Florene Glen, PA-C  for Dr. Dede Query. Rivard

## 2019-11-26 ENCOUNTER — Other Ambulatory Visit: Payer: Self-pay | Admitting: Obstetrics and Gynecology

## 2019-12-01 ENCOUNTER — Other Ambulatory Visit: Payer: Self-pay | Admitting: Obstetrics and Gynecology

## 2019-12-03 ENCOUNTER — Other Ambulatory Visit: Payer: Self-pay

## 2019-12-03 ENCOUNTER — Encounter (HOSPITAL_BASED_OUTPATIENT_CLINIC_OR_DEPARTMENT_OTHER): Payer: Self-pay | Admitting: Obstetrics and Gynecology

## 2019-12-03 NOTE — Progress Notes (Signed)
Spoke w/ via phone for pre-op interview---pt Lab needs dos---- none              Lab results------has lab appt 12-08-19 at 845 am for cbc  COVID test ------12-08-19 at 945 am Arrive at -------1100 am 12-09-2019 NPO after MN NO Solid Food.  Clear liquids from MN until---1000 am and drink ensure presurgery drink at 1000 am Medications to take morning of surgery -----estrogen spray (dries quickly per pt and she needs it) Diabetic medication -----n/a Patient Special Instructions -----none Pre-Op special Istructions -----none Patient verbalized understanding of instructions that were given at this phone interview. Patient denies shortness of breath, chest pain, fever, cough at this phone interview.

## 2019-12-06 ENCOUNTER — Other Ambulatory Visit (HOSPITAL_COMMUNITY): Payer: Federal, State, Local not specified - PPO

## 2019-12-08 ENCOUNTER — Encounter (HOSPITAL_COMMUNITY)
Admission: RE | Admit: 2019-12-08 | Discharge: 2019-12-08 | Disposition: A | Discharge status: Home / Self Care | Payer: Federal, State, Local not specified - PPO | Source: Ambulatory Visit | Attending: Obstetrics and Gynecology | Admitting: Obstetrics and Gynecology

## 2019-12-08 ENCOUNTER — Other Ambulatory Visit (HOSPITAL_COMMUNITY)
Admission: RE | Admit: 2019-12-08 | Discharge: 2019-12-08 | Disposition: A | Discharge status: Home / Self Care | Payer: Federal, State, Local not specified - PPO | Source: Ambulatory Visit | Attending: Obstetrics and Gynecology | Admitting: Obstetrics and Gynecology

## 2019-12-08 ENCOUNTER — Other Ambulatory Visit: Payer: Self-pay

## 2019-12-08 DIAGNOSIS — Z01812 Encounter for preprocedural laboratory examination: Secondary | ICD-10-CM | POA: Diagnosis not present

## 2019-12-08 DIAGNOSIS — Z20822 Contact with and (suspected) exposure to covid-19: Secondary | ICD-10-CM | POA: Diagnosis not present

## 2019-12-08 LAB — CBC
HCT: 37.9 % (ref 36.0–46.0)
Hemoglobin: 11.9 g/dL — ABNORMAL LOW (ref 12.0–15.0)
MCH: 26.7 pg (ref 26.0–34.0)
MCHC: 31.4 g/dL (ref 30.0–36.0)
MCV: 85.2 fL (ref 80.0–100.0)
Platelets: 198 10*3/uL (ref 150–400)
RBC: 4.45 MIL/uL (ref 3.87–5.11)
RDW: 13.7 % (ref 11.5–15.5)
WBC: 3.4 10*3/uL — ABNORMAL LOW (ref 4.0–10.5)
nRBC: 0 % (ref 0.0–0.2)

## 2019-12-08 LAB — SARS CORONAVIRUS 2 (TAT 6-24 HRS): SARS Coronavirus 2: NEGATIVE

## 2019-12-09 ENCOUNTER — Encounter (HOSPITAL_BASED_OUTPATIENT_CLINIC_OR_DEPARTMENT_OTHER): Payer: Self-pay | Admitting: Obstetrics and Gynecology

## 2019-12-09 ENCOUNTER — Ambulatory Visit (HOSPITAL_BASED_OUTPATIENT_CLINIC_OR_DEPARTMENT_OTHER): Payer: Federal, State, Local not specified - PPO | Admitting: Certified Registered Nurse Anesthetist

## 2019-12-09 ENCOUNTER — Encounter (HOSPITAL_BASED_OUTPATIENT_CLINIC_OR_DEPARTMENT_OTHER)
Admission: RE | Disposition: A | Discharge status: Home / Self Care | Payer: Self-pay | Source: Home / Self Care | Attending: Obstetrics and Gynecology

## 2019-12-09 ENCOUNTER — Ambulatory Visit (HOSPITAL_BASED_OUTPATIENT_CLINIC_OR_DEPARTMENT_OTHER)
Admission: RE | Admit: 2019-12-09 | Discharge: 2019-12-09 | Disposition: A | Discharge status: Home / Self Care | Payer: Federal, State, Local not specified - PPO | Attending: Obstetrics and Gynecology | Admitting: Obstetrics and Gynecology

## 2019-12-09 DIAGNOSIS — Z79890 Hormone replacement therapy (postmenopausal): Secondary | ICD-10-CM | POA: Diagnosis not present

## 2019-12-09 DIAGNOSIS — D270 Benign neoplasm of right ovary: Secondary | ICD-10-CM | POA: Insufficient documentation

## 2019-12-09 DIAGNOSIS — N83201 Unspecified ovarian cyst, right side: Secondary | ICD-10-CM

## 2019-12-09 DIAGNOSIS — N83209 Unspecified ovarian cyst, unspecified side: Secondary | ICD-10-CM | POA: Diagnosis not present

## 2019-12-09 DIAGNOSIS — M1990 Unspecified osteoarthritis, unspecified site: Secondary | ICD-10-CM | POA: Diagnosis not present

## 2019-12-09 DIAGNOSIS — Z79899 Other long term (current) drug therapy: Secondary | ICD-10-CM | POA: Diagnosis not present

## 2019-12-09 HISTORY — DX: Migraine, unspecified, not intractable, without status migrainosus: G43.909

## 2019-12-09 HISTORY — DX: Unspecified ovarian cyst, unspecified side: N83.209

## 2019-12-09 HISTORY — DX: Inflammatory liver disease, unspecified: K75.9

## 2019-12-09 HISTORY — DX: Unspecified osteoarthritis, unspecified site: M19.90

## 2019-12-09 HISTORY — PX: XI ROBOTIC ASSISTED OOPHORECTOMY: SHX6823

## 2019-12-09 SURGERY — OOPHORECTOMY, ROBOT-ASSISTED
Anesthesia: General | Laterality: Bilateral

## 2019-12-09 MED ORDER — EPHEDRINE 5 MG/ML INJ
INTRAVENOUS | Status: AC
  Filled 2019-12-09: qty 10

## 2019-12-09 MED ORDER — EPHEDRINE SULFATE 50 MG/ML IJ SOLN
INTRAMUSCULAR | Status: DC | PRN
  Administered 2019-12-09 (×3): 10 mg via INTRAVENOUS

## 2019-12-09 MED ORDER — SODIUM CHLORIDE 0.9 % IR SOLN
Status: DC | PRN
  Administered 2019-12-09: 3000 mL

## 2019-12-09 MED ORDER — KETOROLAC TROMETHAMINE 30 MG/ML IJ SOLN: 30.0000 mg | Freq: Once | INTRAMUSCULAR | Status: DC | PRN

## 2019-12-09 MED ORDER — LIDOCAINE HCL (PF) 2 % IJ SOLN
INTRAMUSCULAR | Status: DC | PRN
  Administered 2019-12-09: 1.5 mg/kg/h

## 2019-12-09 MED ORDER — OXYCODONE HCL 5 MG PO TABS: ORAL_TABLET | ORAL | 0 refills | Status: DC

## 2019-12-09 MED ORDER — FENTANYL CITRATE (PF) 100 MCG/2ML IJ SOLN: 25.0000 ug | INTRAMUSCULAR | Status: DC | PRN

## 2019-12-09 MED ORDER — ACETAMINOPHEN 325 MG PO TABS: 325.0000 mg | ORAL_TABLET | ORAL | Status: DC | PRN

## 2019-12-09 MED ORDER — LIDOCAINE 2% (20 MG/ML) 5 ML SYRINGE
INTRAMUSCULAR | Status: AC
  Filled 2019-12-09: qty 10

## 2019-12-09 MED ORDER — MIDAZOLAM HCL 2 MG/2ML IJ SOLN
INTRAMUSCULAR | Status: AC
  Filled 2019-12-09: qty 2

## 2019-12-09 MED ORDER — DEXAMETHASONE SODIUM PHOSPHATE 10 MG/ML IJ SOLN
INTRAMUSCULAR | Status: AC
  Filled 2019-12-09: qty 1

## 2019-12-09 MED ORDER — ACETAMINOPHEN 500 MG PO TABS: ORAL_TABLET | ORAL | 1 refills | Status: DC

## 2019-12-09 MED ORDER — POVIDONE-IODINE 10 % EX SWAB: 2.0000 "application " | Freq: Once | CUTANEOUS | Status: DC

## 2019-12-09 MED ORDER — ONDANSETRON HCL 4 MG/2ML IJ SOLN: 4.0000 mg | Freq: Once | INTRAMUSCULAR | Status: DC | PRN

## 2019-12-09 MED ORDER — ACETAMINOPHEN 500 MG PO TABS
ORAL_TABLET | ORAL | Status: AC
  Filled 2019-12-09: qty 2

## 2019-12-09 MED ORDER — IBUPROFEN 600 MG PO TABS: ORAL_TABLET | ORAL | 0 refills | Status: DC

## 2019-12-09 MED ORDER — ACETAMINOPHEN 160 MG/5ML PO SOLN: 325.0000 mg | ORAL | Status: DC | PRN

## 2019-12-09 MED ORDER — KETOROLAC TROMETHAMINE 30 MG/ML IJ SOLN
INTRAMUSCULAR | Status: AC
  Filled 2019-12-09: qty 1

## 2019-12-09 MED ORDER — LIDOCAINE 2% (20 MG/ML) 5 ML SYRINGE
INTRAMUSCULAR | Status: DC | PRN
  Administered 2019-12-09: 80 mg via INTRAVENOUS

## 2019-12-09 MED ORDER — DEXAMETHASONE SODIUM PHOSPHATE 10 MG/ML IJ SOLN
INTRAMUSCULAR | Status: DC | PRN
  Administered 2019-12-09: 10 mg via INTRAVENOUS

## 2019-12-09 MED ORDER — SODIUM CHLORIDE 0.9 % IV SOLN
INTRAVENOUS | Status: AC
  Filled 2019-12-09: qty 2

## 2019-12-09 MED ORDER — PROPOFOL 10 MG/ML IV BOLUS
INTRAVENOUS | Status: AC
  Filled 2019-12-09: qty 20

## 2019-12-09 MED ORDER — ONDANSETRON HCL 4 MG/2ML IJ SOLN
INTRAMUSCULAR | Status: DC | PRN
  Administered 2019-12-09: 4 mg via INTRAVENOUS

## 2019-12-09 MED ORDER — SCOPOLAMINE 1 MG/3DAYS TD PT72
1.0000 | MEDICATED_PATCH | TRANSDERMAL | Status: DC
  Administered 2019-12-09: 1.5 mg via TRANSDERMAL

## 2019-12-09 MED ORDER — FENTANYL CITRATE (PF) 100 MCG/2ML IJ SOLN
INTRAMUSCULAR | Status: DC | PRN
Start: 2019-12-09 — End: 2019-12-09
  Administered 2019-12-09: 50 ug via INTRAVENOUS
  Administered 2019-12-09 (×2): 100 ug via INTRAVENOUS

## 2019-12-09 MED ORDER — OXYCODONE HCL 5 MG PO TABS
5.0000 mg | ORAL_TABLET | Freq: Once | ORAL | Status: AC | PRN
  Administered 2019-12-09: 5 mg via ORAL

## 2019-12-09 MED ORDER — FENTANYL CITRATE (PF) 250 MCG/5ML IJ SOLN
INTRAMUSCULAR | Status: AC
  Filled 2019-12-09: qty 5

## 2019-12-09 MED ORDER — ONDANSETRON HCL 4 MG/2ML IJ SOLN
INTRAMUSCULAR | Status: AC
  Filled 2019-12-09: qty 2

## 2019-12-09 MED ORDER — CELECOXIB 200 MG PO CAPS
ORAL_CAPSULE | ORAL | Status: AC
  Filled 2019-12-09: qty 2

## 2019-12-09 MED ORDER — SCOPOLAMINE 1 MG/3DAYS TD PT72
MEDICATED_PATCH | TRANSDERMAL | Status: AC
  Filled 2019-12-09: qty 1

## 2019-12-09 MED ORDER — SUGAMMADEX SODIUM 200 MG/2ML IV SOLN
INTRAVENOUS | Status: DC | PRN
  Administered 2019-12-09: 200 mg via INTRAVENOUS

## 2019-12-09 MED ORDER — LACTATED RINGERS IV SOLN: INTRAVENOUS | Status: DC

## 2019-12-09 MED ORDER — PROPOFOL 10 MG/ML IV BOLUS
INTRAVENOUS | Status: DC | PRN
  Administered 2019-12-09: 150 mg via INTRAVENOUS

## 2019-12-09 MED ORDER — ENSURE PRE-SURGERY PO LIQD: 296.0000 mL | Freq: Once | ORAL | Status: DC

## 2019-12-09 MED ORDER — MEPERIDINE HCL 25 MG/ML IJ SOLN: 6.2500 mg | INTRAMUSCULAR | Status: DC | PRN

## 2019-12-09 MED ORDER — GABAPENTIN 300 MG PO CAPS
300.0000 mg | ORAL_CAPSULE | ORAL | Status: AC
  Administered 2019-12-09: 300 mg via ORAL

## 2019-12-09 MED ORDER — ROCURONIUM BROMIDE 10 MG/ML (PF) SYRINGE
PREFILLED_SYRINGE | INTRAVENOUS | Status: DC | PRN
  Administered 2019-12-09: 70 mg via INTRAVENOUS
  Administered 2019-12-09: 30 mg via INTRAVENOUS

## 2019-12-09 MED ORDER — ROPIVACAINE HCL 0.5 % EP SOLN
EPIDURAL | Status: DC | PRN
  Administered 2019-12-09: 10 mL

## 2019-12-09 MED ORDER — ROCURONIUM BROMIDE 10 MG/ML (PF) SYRINGE
PREFILLED_SYRINGE | INTRAVENOUS | Status: AC
  Filled 2019-12-09: qty 10

## 2019-12-09 MED ORDER — ARTIFICIAL TEARS OPHTHALMIC OINT
TOPICAL_OINTMENT | OPHTHALMIC | Status: AC
  Filled 2019-12-09: qty 7

## 2019-12-09 MED ORDER — OXYCODONE HCL 5 MG PO TABS
ORAL_TABLET | ORAL | Status: AC
  Filled 2019-12-09: qty 1

## 2019-12-09 MED ORDER — MIDAZOLAM HCL 5 MG/5ML IJ SOLN
INTRAMUSCULAR | Status: DC | PRN
  Administered 2019-12-09 (×2): 1 mg via INTRAVENOUS

## 2019-12-09 MED ORDER — CELECOXIB 200 MG PO CAPS
400.0000 mg | ORAL_CAPSULE | ORAL | Status: AC
  Administered 2019-12-09: 400 mg via ORAL

## 2019-12-09 MED ORDER — SODIUM CHLORIDE 0.9 % IV SOLN
2.0000 g | INTRAVENOUS | Status: AC
  Administered 2019-12-09: 2 g via INTRAVENOUS

## 2019-12-09 MED ORDER — GABAPENTIN 300 MG PO CAPS
ORAL_CAPSULE | ORAL | Status: AC
  Filled 2019-12-09: qty 1

## 2019-12-09 MED ORDER — OXYCODONE HCL 5 MG/5ML PO SOLN: 5.0000 mg | Freq: Once | ORAL | Status: AC | PRN

## 2019-12-09 MED ORDER — ACETAMINOPHEN 500 MG PO TABS
1000.0000 mg | ORAL_TABLET | ORAL | Status: AC
  Administered 2019-12-09: 1000 mg via ORAL

## 2019-12-09 SURGICAL SUPPLY — 65 items
ADH SKN CLS APL DERMABOND .7 (GAUZE/BANDAGES/DRESSINGS) ×1
BAG SPEC RTRVL LRG 6X4 10 (ENDOMECHANICALS) ×1
BARRIER ADHS 3X4 INTERCEED (GAUZE/BANDAGES/DRESSINGS) ×2 IMPLANT
BRR ADH 4X3 ABS CNTRL BYND (GAUZE/BANDAGES/DRESSINGS) ×1
CATH FOLEY 2WAY SLVR  5CC 14FR (CATHETERS) ×2
CATH FOLEY 2WAY SLVR 5CC 14FR (CATHETERS) ×1 IMPLANT
CATH FOLEY 3WAY  5CC 16FR (CATHETERS) ×2
CATH FOLEY 3WAY 5CC 16FR (CATHETERS) ×1 IMPLANT
COVER BACK TABLE 60X90IN (DRAPES) ×2 IMPLANT
COVER TIP SHEARS 8 DVNC (MISCELLANEOUS) ×1 IMPLANT
COVER TIP SHEARS 8MM DA VINCI (MISCELLANEOUS) ×2
COVER WAND RF STERILE (DRAPES) ×2 IMPLANT
DEFOGGER SCOPE WARMER CLEARIFY (MISCELLANEOUS) ×2 IMPLANT
DERMABOND ADVANCED (GAUZE/BANDAGES/DRESSINGS) ×1
DERMABOND ADVANCED .7 DNX12 (GAUZE/BANDAGES/DRESSINGS) ×1 IMPLANT
DILATOR CANAL MILEX (MISCELLANEOUS) ×2 IMPLANT
DRAPE ARM DVNC X/XI (DISPOSABLE) ×4 IMPLANT
DRAPE COLUMN DVNC XI (DISPOSABLE) ×1 IMPLANT
DRAPE DA VINCI XI ARM (DISPOSABLE) ×8
DRAPE DA VINCI XI COLUMN (DISPOSABLE) ×2
DRAPE UTILITY XL STRL (DRAPES) ×2 IMPLANT
DURAPREP 26ML APPLICATOR (WOUND CARE) ×2 IMPLANT
ELECT REM PT RETURN 9FT ADLT (ELECTROSURGICAL) ×2
ELECTRODE REM PT RTRN 9FT ADLT (ELECTROSURGICAL) ×1 IMPLANT
GLOVE BIO SURGEON STRL SZ7 (GLOVE) ×2 IMPLANT
GLOVE BIOGEL PI IND STRL 7.0 (GLOVE) ×5 IMPLANT
GLOVE BIOGEL PI INDICATOR 7.0 (GLOVE) ×5
GLOVE ECLIPSE 6.5 STRL STRAW (GLOVE) ×6 IMPLANT
HOLDER FOLEY CATH W/STRAP (MISCELLANEOUS) IMPLANT
IRRIG SUCT STRYKERFLOW 2 WTIP (MISCELLANEOUS) ×2
IRRIGATION SUCT STRKRFLW 2 WTP (MISCELLANEOUS) ×1 IMPLANT
LEGGING LITHOTOMY PAIR STRL (DRAPES) ×2 IMPLANT
MANIPULATOR UTERINE 4.5 ZUMI (MISCELLANEOUS) ×2 IMPLANT
NEEDLE HYPO 22GX1.5 SAFETY (NEEDLE) ×2 IMPLANT
OBTURATOR OPTICAL STANDARD 8MM (TROCAR) ×2
OBTURATOR OPTICAL STND 8 DVNC (TROCAR) ×1
OBTURATOR OPTICALSTD 8 DVNC (TROCAR) ×1 IMPLANT
PACK ROBOT WH (CUSTOM PROCEDURE TRAY) ×2 IMPLANT
PACK ROBOTIC GOWN (GOWN DISPOSABLE) ×2 IMPLANT
PACK TRENDGUARD 450 HYBRID PRO (MISCELLANEOUS) ×1 IMPLANT
PAD OB MATERNITY 4.3X12.25 (PERSONAL CARE ITEMS) ×2 IMPLANT
PAD PREP 24X48 CUFFED NSTRL (MISCELLANEOUS) ×2 IMPLANT
POUCH LAPAROSCOPIC INSTRUMENT (MISCELLANEOUS) ×2 IMPLANT
POUCH SPECIMEN RETRIEVAL 10MM (ENDOMECHANICALS) ×2 IMPLANT
SEAL CANN UNIV 5-8 DVNC XI (MISCELLANEOUS) ×4 IMPLANT
SEAL XI 5MM-8MM UNIVERSAL (MISCELLANEOUS) ×8
SEALER VESSEL DA VINCI XI (MISCELLANEOUS) ×2
SEALER VESSEL EXT DVNC XI (MISCELLANEOUS) ×1 IMPLANT
SET IRRIG Y TYPE TUR BLADDER L (SET/KITS/TRAYS/PACK) IMPLANT
SET TRI-LUMEN FLTR TB AIRSEAL (TUBING) ×2 IMPLANT
SOL PREP PROV IODINE SCRUB 4OZ (MISCELLANEOUS) ×2 IMPLANT
STRIP CLOSURE SKIN 1/4X4 (GAUZE/BANDAGES/DRESSINGS) ×2 IMPLANT
SUT MNCRL AB 3-0 PS2 27 (SUTURE) ×4 IMPLANT
SUT VIC AB 0 CT1 27 (SUTURE) ×2
SUT VIC AB 0 CT1 27XBRD ANBCTR (SUTURE) ×1 IMPLANT
SUT VIC AB 2-0 UR6 27 (SUTURE) ×2 IMPLANT
SUT VICRYL 0 UR6 27IN ABS (SUTURE) ×2 IMPLANT
SUT VLOC 180 0 9IN  GS21 (SUTURE)
SUT VLOC 180 0 9IN GS21 (SUTURE) IMPLANT
SYR 3ML 18GX1 1/2 (SYRINGE) ×1 IMPLANT
TIP UTERINE 6.7X8CM BLUE DISP (MISCELLANEOUS) IMPLANT
TOWEL OR 17X26 10 PK STRL BLUE (TOWEL DISPOSABLE) ×2 IMPLANT
TRENDGUARD 450 HYBRID PRO PACK (MISCELLANEOUS) ×2
TROCAR XCEL NON-BLD 11X100MML (ENDOMECHANICALS) ×2 IMPLANT
WATER STERILE IRR 1000ML POUR (IV SOLUTION) ×2 IMPLANT

## 2019-12-09 NOTE — Anesthesia Preprocedure Evaluation (Signed)
Anesthesia Evaluation  Patient identified by MRN, date of birth, ID band Patient awake    Reviewed: Allergy & Precautions, NPO status , Patient's Chart, lab work & pertinent test results  Airway Mallampati: I       Dental no notable dental hx.    Pulmonary neg pulmonary ROS,    Pulmonary exam normal        Cardiovascular negative cardio ROS Normal cardiovascular exam     Neuro/Psych  Headaches, negative psych ROS   GI/Hepatic negative GI ROS,   Endo/Other  negative endocrine ROS  Renal/GU negative Renal ROS  negative genitourinary   Musculoskeletal   Abdominal Normal abdominal exam  (+)   Peds  Hematology negative hematology ROS (+)   Anesthesia Other Findings   Reproductive/Obstetrics                             Anesthesia Physical Anesthesia Plan  ASA: I  Anesthesia Plan: General   Post-op Pain Management:    Induction: Intravenous  PONV Risk Score and Plan: 4 or greater and Ondansetron, Dexamethasone and Midazolam  Airway Management Planned: Oral ETT  Additional Equipment: None  Intra-op Plan:   Post-operative Plan: Extubation in OR  Informed Consent: I have reviewed the patients History and Physical, chart, labs and discussed the procedure including the risks, benefits and alternatives for the proposed anesthesia with the patient or authorized representative who has indicated his/her understanding and acceptance.     Dental advisory given  Plan Discussed with: CRNA  Anesthesia Plan Comments:         Anesthesia Quick Evaluation

## 2019-12-09 NOTE — Op Note (Signed)
Preoperative diagnosis: Persistent right ovarian cyst  Postoperative diagnosis: same  Anesthesia: General   Anesthesiologist: Dr. Jillyn Hidden  Procedure: Robotically assisted right salpyngo-oophorectomy with left salpyngectomy and pelvic washings  Surgeon: Dr. Katharine Look Dyquan Minks   Assistant: Earnstine Regal P.A.-C .   Estimated blood loss: minimal  Procedure:   After being informed of the planned procedure with possible complications including but not limited to bleeding, infection, injury to other organs, need for laparotomy, expected hospital stay and recovery, informed consent is obtained and patient is taken to or #5. She is placed in lithotomy position on Trengard with both arms padded and tucked on each side and bilateral knee-high sequential compressive devices. She is given general anesthesia with endotracheal intubation without any complication. She is prepped and draped in a sterile fashion. A  Foley catheter is inserted in her bladder.   Pelvic exam reveals: retroverted uterus with normal left adnexa and right ovarian mass, 6-8 cm, smooth and mobile  A weighted speculum is inserted in the vagina and the anterior lip of the cervix is grasped with a tenaculum forcep. We proceed with a paracervical block and vaginal infiltration using ropivacaine 0.5% diluted 1 in 1 with saline. The uterus was then sounded at 8 cm. We easily dilate the cervix using Hegar dilator to #27 which allows for easy placement of the intrauterine ZUMI manipulator.  Trocar placement is decided. We infiltrate at the umbilicus with 10 cc of ropivacaine per protocol and perform a 10 mm semi-elleptical incision which is brought down bluntly to the fascia. The fascia is identified and grasped with Coker forceps. The fascia is incised with Mayo scissors. Peritoneum is entered bluntly. A pursestring suture of 0 Vicryl is placed on the fascia and a 10 mm Hassan trocar is easily inserted in the abdominal cavity held in placed  with a Purstring suture. This allows for easy insufflation of a pneumoperitoneum using warmed CO2 at a maximum pressure of 15 mm of mercury. We then placed one 25mm robotic trocar on the left and one 11mm robotic trocar on the right  after infiltrating every site with ropivacaine per protocol. The robot is docked on the right of the patient after positioning her in Trendelenburg. Vessel Seal is inserted in arm #4 and a Long Bipolar Forceps is inserted in arm #2.  Preparation and docking is completed in 44 minutes.     Observation: anterior and posterior cul-de-sac are normal, uterus is normal except for a 1 cm fibroid on th posterior wall of the uterus, left tube and ovary are normal, right tube is normal. Right ovarian cyst measuring 8 cm, smooth surface with elongated utero-ovarian ligament and comb-like vessels. Appearance compatible with a benign ovarian cyst. Liver, gallbladder and appendix are normal.   We proceed with pelvic washings and then send 60 cc of Ropivacaine 0.5 % diluted 1 in 1 is sent in the pelvis.After identifying the right ureter, we Vessel Seal and cut the left infundibulopelvic ligament. We Vessel Seal and cut the right tube at its uterine insertion, the right utero-ovarian ligament and the mesosalpynx.The right adnexa is placed in the posterior cul-de-sac.  We proceed with salpyngectomy  on the left  Side by sealing and cutting the tube at its uterine insertion and the mesosalpynx.    Hemostasis is adequate.Both ureters are verified and normal.  Console time is 16 minutes. Instruments are removed and the robot is undocked.  The specimen is removed from the pelvis, intact in an endobag, through the umbilical incision after  changing the umbilical trocar to a 10 mm size. The bag is brought out to the skin. The cyst is aspirated.The specimen is easily removed.    All trochars are removed under direct visualization after evacuating the pneumoperitoneum.   The fascia of the  supraumbilical incision is closed with a pursestring suture of 0 Vicryl. All incisions are then closed with subcuticular suture of 3-0 Monocryl and Dermabond.   The Hills manipulator is removed. A speculum is inserted in the vagina. Hemostasis on the cervix is adequate. We note a small amount of bleeding from the uterus.  Hemostasis is adequate.  Instrument and sponge count is complete x2. Estimated blood loss is minimal.   The procedure is well tolerated by the patient who  is taken to recovery room in a well and stable condition.   Specimen: Right ovary and both tubes sent to pathology. Pelvic washings and cyst fluid sent to cytology

## 2019-12-09 NOTE — Transfer of Care (Signed)
Immediate Anesthesia Transfer of Care Note  Patient: KIAHNA BANGHART  Procedure(s) Performed: XI ROBOTIC ASSISTED RIGHT SALPINGO-OOPHORECTOMY, LEFT SALPINGECTOMY  WITH PELVIC WASHINGS (Bilateral )  Patient Location: PACU  Anesthesia Type:General  Level of Consciousness: drowsy  Airway & Oxygen Therapy: Patient Spontanous Breathing and Patient connected to nasal cannula oxygen  Post-op Assessment: Report given to RN and Post -op Vital signs reviewed and stable  Post vital signs: Reviewed and stable  Last Vitals:  Vitals Value Taken Time  BP 140/89 12/09/19 1456  Temp    Pulse 79 12/09/19 1459  Resp 15 12/09/19 1459  SpO2 100 % 12/09/19 1459  Vitals shown include unvalidated device data.  Last Pain:  Vitals:   12/09/19 1139  TempSrc: Oral  PainSc: 0-No pain      Patients Stated Pain Goal: 3 (70/26/37 8588)  Complications: No complications documented.

## 2019-12-09 NOTE — Anesthesia Postprocedure Evaluation (Signed)
Anesthesia Post Note  Patient: Erica Reilly  Procedure(s) Performed: XI ROBOTIC ASSISTED RIGHT SALPINGO-OOPHORECTOMY, LEFT SALPINGECTOMY  WITH PELVIC WASHINGS (Bilateral )     Patient location during evaluation: PACU Anesthesia Type: General Level of consciousness: awake and alert Pain management: pain level controlled Vital Signs Assessment: post-procedure vital signs reviewed and stable Respiratory status: spontaneous breathing, nonlabored ventilation, respiratory function stable and patient connected to nasal cannula oxygen Cardiovascular status: blood pressure returned to baseline and stable Postop Assessment: no apparent nausea or vomiting Anesthetic complications: no   No complications documented.  Last Vitals:  Vitals:   12/09/19 1600 12/09/19 1630  BP: 123/81 134/74  Pulse: 70 62  Resp: 14 15  Temp:  36.5 C  SpO2: 100% 100%    Last Pain:  Vitals:   12/09/19 1630  TempSrc:   PainSc: 0-No pain                 Hermie Reagor S

## 2019-12-09 NOTE — Anesthesia Procedure Notes (Signed)
Procedure Name: Intubation Date/Time: 12/09/2019 1:05 PM Performed by: Rogers Blocker, CRNA Pre-anesthesia Checklist: Patient identified, Emergency Drugs available, Suction available and Patient being monitored Patient Re-evaluated:Patient Re-evaluated prior to induction Oxygen Delivery Method: Circle System Utilized Preoxygenation: Pre-oxygenation with 100% oxygen Induction Type: IV induction Ventilation: Mask ventilation without difficulty Laryngoscope Size: Mac and 4 Grade View: Grade I Tube type: Oral Number of attempts: 1 Airway Equipment and Method: Stylet Placement Confirmation: ETT inserted through vocal cords under direct vision,  positive ETCO2 and breath sounds checked- equal and bilateral Tube secured with: Tape Dental Injury: Teeth and Oropharynx as per pre-operative assessment

## 2019-12-09 NOTE — Interval H&P Note (Signed)
History and Physical Interval Note:  12/09/2019 12:43 PM  Erica Reilly  has presented today for surgery, with the diagnosis of right ovarian cyst.  The various methods of treatment have been discussed with the patient and family. After consideration of risks, benefits and other options for treatment, the patient has consented to  Robotic assisted right salpyngooophorectomy and left salpyngectomy for persistent right ovarian cyst.  as a surgical intervention.  The patient's history has been reviewed, patient examined, no change in status, stable for surgery.  I have reviewed the patient's chart and labs.  Questions were answered to the patient's satisfaction.     Katharine Look A Khaniyah Bezek

## 2019-12-09 NOTE — Discharge Instructions (Signed)
Laparoscopic Tubal Ligation, Care After This sheet gives you information about how to care for yourself after your procedure. Your health care provider may also give you more specific instructions. If you have problems or questions, contact your health care provider. What can I expect after the procedure? After the procedure, it is common to have:  A sore throat.  Discomfort in your shoulder.  Mild discomfort or cramping in your abdomen.  Gas pains.  Pain or soreness in the area where the surgical incision was made.  A bloated feeling.  Tiredness.  Nausea.  Vomiting. Follow these instructions at home: Medicines  Take over-the-counter and prescription medicines only as told by your health care provider.  Do not take aspirin because it can cause bleeding.  Ask your health care provider if the medicine prescribed to you: ? Requires you to avoid driving or using heavy machinery. ? Can cause constipation. You may need to take actions to prevent or treat constipation, such as:  Drink enough fluid to keep your urine pale yellow.  Take over-the-counter or prescription medicines.  Eat foods that are high in fiber, such as beans, whole grains, and fresh fruits and vegetables.  Limit foods that are high in fat and processed sugars, such as fried or sweet foods. Incision care      Follow instructions from your health care provider about how to take care of your incision. Make sure you: ? Wash your hands with soap and water before and after you change your bandage (dressing). If soap and water are not available, use hand sanitizer. ? Change your dressing as told by your health care provider. ? Leave stitches (sutures), skin glue, or adhesive strips in place. These skin closures may need to stay in place for 2 weeks or longer. If adhesive strip edges start to loosen and curl up, you may trim the loose edges. Do not remove adhesive strips completely unless your health care provider  tells you to do that.  Check your incision area every day for signs of infection. Check for: ? Redness, swelling, or pain. ? Fluid or blood. ? Warmth. ? Pus or a bad smell. Activity  Rest as told by your health care provider.  Avoid sitting for a long time without moving. Get up to take short walks every 1-2 hours. This is important to improve blood flow and breathing. Ask for help if you feel weak or unsteady.  Return to your normal activities as told by your health care provider. Ask your health care provider what activities are safe for you. General instructions  Do not take baths, swim, or use a hot tub until your health care provider approves. Ask your health care provider if you may take showers. You may only be allowed to take sponge baths.  Have someone help you with your daily household tasks for the first few days.  Keep all follow-up visits as told by your health care provider. This is important. Contact a health care provider if:  You have redness, swelling, or pain around your incision.  Your incision feels warm to the touch.  You have pus or a bad smell coming from your incision.  The edges of your incision break open after the sutures have been removed.  Your pain does not improve after 2-3 days.  You have a rash.  You repeatedly become dizzy or light-headed.  Your pain medicine is not helping. Get help right away if you:  Have a fever.  Faint.  Have increasing   pain in your abdomen.  Have severe pain in one or both of your shoulders.  Have fluid or blood coming from your sutures or from your vagina.  Have shortness of breath or difficulty breathing.  Have chest pain or leg pain.  Have ongoing nausea, vomiting, or diarrhea. Summary  After the procedure, it is common to have mild discomfort or cramping in your abdomen.  Take over-the-counter and prescription medicines only as told by your health care provider.  Watch for symptoms that should  prompt you to call your health care provider.  Keep all follow-up visits as told by your health care provider. This is important. This information is not intended to replace advice given to you by your health care provider. Make sure you discuss any questions you have with your health care provider. Document Revised: 07/23/2018 Document Reviewed: 01/08/2018 Elsevier Patient Education  [place] Instructions  Activity: Get plenty of rest for the remainder of the day. A responsible individual must stay with you for 24 hours following the procedure.  For the next 24 hours, DO NOT: -Drive a car -Paediatric nurse -Drink alcoholic beverages -Take any medication unless instructed by your physician -Make any legal decisions or sign important papers.  Meals: Start with liquid foods such as gelatin or soup. Progress to regular foods as tolerated. Avoid greasy, spicy, heavy foods. If nausea and/or vomiting occur, drink only clear liquids until the nausea and/or vomiting subsides. Call your physician if vomiting continues.  Special Instructions/Symptoms: Your throat may feel dry or sore from the anesthesia or the breathing tube placed in your throat during surgery. If this causes discomfort, gargle with warm salt water. The discomfort should disappear within 24 hours.  If you had a scopolamine patch placed behind your ear for the management of post- operative nausea and/or vomiting:  1. The medication in the patch is effective for 72 hours, after which it should be removed.  Wrap patch in a tissue and discard in the trash. Wash hands thoroughly with soap and water. 2. You may remove the patch earlier than 72 hours if you experience unpleasant side effects which may include dry mouth, dizziness or visual disturbances. 3. Avoid touching the patch. Wash your hands with soap and water after contact with the patch.    Call [facility] @ [phone]  if:  You have a temperature greater than or equal to 100.4 degrees Farenheit orally You have pain that is not made better by the pain medication given and taken as directed You have excessive bleeding-changing a pad more often than every hour or problems urinating.  [You may experience some light  vaginal bleeding following surgery]  Take Colace (Docusate Sodium/Stool Softener) 100 mg 2-3 times daily while taking narcotic pain medicine to avoid constipation or until bowel movements are regular. Take Ibuprofen 600 mg plus  Acetaminophen (Tylenol) #2-500 mg tablets with food,  every 6 hours for 5 days then as needed for pain.  Take your first dose of both medications today at 6 p.m.  You may drive after 24 hours You may walk up steps  You may shower tomorrow You may resume a regular diet today  Keep incisions clean and dry Do not lift over 15 pounds for 6 weeks Avoid anything in vagina for 6 weeks (or until after your post-operative visit)

## 2019-12-10 ENCOUNTER — Encounter (HOSPITAL_BASED_OUTPATIENT_CLINIC_OR_DEPARTMENT_OTHER): Payer: Self-pay | Admitting: Obstetrics and Gynecology

## 2019-12-10 LAB — CYTOLOGY - NON PAP

## 2019-12-10 LAB — SURGICAL PATHOLOGY

## 2020-01-21 DIAGNOSIS — Z1231 Encounter for screening mammogram for malignant neoplasm of breast: Secondary | ICD-10-CM | POA: Diagnosis not present

## 2020-03-19 DIAGNOSIS — M542 Cervicalgia: Secondary | ICD-10-CM | POA: Diagnosis not present

## 2020-05-17 DIAGNOSIS — N898 Other specified noninflammatory disorders of vagina: Secondary | ICD-10-CM | POA: Diagnosis not present

## 2020-05-17 DIAGNOSIS — R309 Painful micturition, unspecified: Secondary | ICD-10-CM | POA: Diagnosis not present

## 2020-07-25 ENCOUNTER — Emergency Department (HOSPITAL_BASED_OUTPATIENT_CLINIC_OR_DEPARTMENT_OTHER)
Admission: EM | Admit: 2020-07-25 | Discharge: 2020-07-25 | Disposition: A | Discharge status: Home / Self Care | Payer: Federal, State, Local not specified - PPO | Attending: Emergency Medicine | Admitting: Emergency Medicine

## 2020-07-25 ENCOUNTER — Encounter (HOSPITAL_BASED_OUTPATIENT_CLINIC_OR_DEPARTMENT_OTHER): Payer: Self-pay

## 2020-07-25 ENCOUNTER — Other Ambulatory Visit: Payer: Self-pay

## 2020-07-25 DIAGNOSIS — S81852A Open bite, left lower leg, initial encounter: Secondary | ICD-10-CM | POA: Diagnosis not present

## 2020-07-25 DIAGNOSIS — S81851A Open bite, right lower leg, initial encounter: Secondary | ICD-10-CM | POA: Diagnosis not present

## 2020-07-25 DIAGNOSIS — W5501XA Bitten by cat, initial encounter: Secondary | ICD-10-CM | POA: Diagnosis not present

## 2020-07-25 MED ORDER — AMOXICILLIN-POT CLAVULANATE 875-125 MG PO TABS: 1.0000 | ORAL_TABLET | Freq: Once | ORAL | Status: DC

## 2020-07-25 MED ORDER — AMOXICILLIN-POT CLAVULANATE 875-125 MG PO TABS: 1.0000 | ORAL_TABLET | Freq: Two times a day (BID) | ORAL | 0 refills | Status: AC

## 2020-07-25 NOTE — Discharge Instructions (Addendum)
Take the antibiotic as prescribed.  Please schedule a follow-up appointment to be seen in the clinic on Tuesday with either your primary doctor or the orthopedic doctor.  If in the meantime the redness worsens, you have increased swelling, any fever or streaking redness, please return to ER for reassessment.

## 2020-07-25 NOTE — ED Notes (Signed)
Pt verbalizes understanding of discharge instructions. Opportunity for questioning and answers were provided. Armand removed by staff, pt discharged from ED to home.  
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
## 2020-07-26 NOTE — ED Provider Notes (Signed)
[facility] EMERGENCY DEPT Provider Note   CSN: [id number] Arrival date & time: 07/25/20  1804     History Chief Complaint  Patient presents with  . Animal Bite    Erica Reilly is a 61 y.o. female.  Presents to ER with concern for cellulitis around the site of a cat bite.  Patient reports that she was bitten a few days ago by her cat on her left leg.  Today she noted a small area of redness around the site.  Is not particularly painful.  Has not had any fever.  She denies any chronic medical conditions.  Reports her tetanus is up-to-date.  HPI     Past Medical History:  Diagnosis Date  . Allergic rhinitis, mild   . Arthritis    knees and back  . Hepatitis as infant   no problem since  . LEUKOPENIA, CHRONIC    2019 - patient unsure of this diagnosis  . Migraine   . Ovarian cyst    right    Patient Active Problem List   Diagnosis Date Noted  . Ovarian cyst 12/09/2019  . Cyst of ovary 12/03/2017  . Spider veins of both lower extremities 01/07/2014  . LEUKOPENIA, CHRONIC 06/08/2009  . HOT FLASHES 09/14/2008  . HEADACHE 09/14/2008    Past Surgical History:  Procedure Laterality Date  . COLONOSCOPY     2003 2008 2019  . HAMMER TOE SURGERY Bilateral yrs ago  . KNEE ARTHROSCOPY Right 2018  . TONSILLECTOMY  age 35 or 56  . XI ROBOTIC ASSISTED OOPHORECTOMY Bilateral 12/09/2019   Procedure: XI ROBOTIC ASSISTED RIGHT SALPINGO-OOPHORECTOMY, LEFT SALPINGECTOMY  WITH PELVIC WASHINGS;  Surgeon: Delsa Bern, MD;  Location: [facility];  Service: Gynecology;  Laterality: Bilateral;     OB History   No obstetric history on file.     Family History  Problem Relation Age of Onset  . Colon cancer Father   . Brain cancer Father   . Prostate cancer Father   . Diabetes Mother   . Arthritis Mother   . Hypertension Mother   . Hypertension Sister   . Heart murmur Brother     Social History   Tobacco Use  . Smoking status: Never Smoker   . Smokeless tobacco: Never Used  Vaping Use  . Vaping Use: Never used  Substance Use Topics  . Alcohol use: Yes    Comment: occ  . Drug use: No    Home Medications Prior to Admission medications   Medication Sig Start Date End Date Taking? Authorizing Provider  amoxicillin-clavulanate (AUGMENTIN) 875-125 MG tablet Take 1 tablet by mouth every 12 (twelve) hours for 10 days. 07/25/20 08/04/20 Yes Lucrezia Starch, MD  acetaminophen (TYLENOL) 500 MG tablet take 2 tablets po every 6 hours for 5 days then prn-pain (1st dose 6 pm today) 12/09/19   Earnstine Regal, PA-C  acidophilus (RISAQUAD) CAPS capsule Take by mouth daily. probiotic    [provider]  acyclovir (ZOVIRAX) 400 MG tablet TAKE 1 TABLET BY MOUTH THREE TIMES DAILY FOR 5 TO 7 DAYS AS NEEDED FOR OUTBREAK 10/11/15   Colin Benton R, DO  BIOTIN PO in the morning and at bedtime. 5000 mg    [provider]  Chromium Picolinate (CHROMIUM PICOLATE PO) Take by mouth daily.    [provider]  Emollient (COLLAGEN EX) by Does not apply route. 1 scoop in coffee    [provider]  estradiol (EVAMIST) 1.53 MG/SPRAY transdermal spray  Place 2 sprays onto the skin daily. 02/16/12   Earnstine Regal, PA-C  finasteride (PROSCAR) 5 MG tablet at bedtime. 2.5 mg daily 09/24/17   [provider]  glucosamine-chondroitin 500-400 MG tablet Take 1 tablet by mouth in the morning and at bedtime.     [provider]  ibuprofen (ADVIL) 600 MG tablet take 1 tablet po pc every 6 hours for 5 days (1st dose 6 pm today) then prn-pain 12/09/19   Earnstine Regal, PA-C  Multiple Vitamin (MULTIVITAMIN) capsule Take 1 capsule by mouth in the morning and at bedtime.     [provider]  Omega-3 Fatty Acids ([employer] FISH OIL PO) in the morning and at bedtime.     [provider]  oxyCODONE (ROXICODONE) 5 MG immediate release tablet take 1 tablet every 6 hours as needed for breakthrough post operative pain  12/09/19   Earnstine Regal, PA-C  progesterone (PROMETRIUM) 100 MG capsule Take 1 capsule (100 mg total) by mouth daily. Patient taking differently: Take 100 mg by mouth at bedtime.  11/27/11   Earnstine Regal, PA-C  SUMAtriptan (IMITREX) 25 MG tablet Take 25 mg by mouth every 2 (two) hours as needed for migraine. May repeat in 2 hours if headache persists or recurs.    [provider]    Allergies    Patient has no known allergies.  Review of Systems   Review of Systems  Constitutional: Negative for chills and fever.  HENT: Negative for ear pain and sore throat.   Eyes: Negative for pain and visual disturbance.  Respiratory: Negative for cough and shortness of breath.   Cardiovascular: Negative for chest pain and palpitations.  Gastrointestinal: Negative for abdominal pain and vomiting.  Genitourinary: Negative for dysuria and hematuria.  Musculoskeletal: Negative for arthralgias and back pain.  Skin: Positive for rash and wound. Negative for color change.  Neurological: Negative for seizures and syncope.  All other systems reviewed and are negative.   Physical Exam Updated Vital Signs Pulse 100   Temp 98.2 F (36.8 C)   Resp 16   Ht 5\' 5"  (1.651 m)   Wt 68 kg   BMI 24.96 kg/m   Physical Exam Vitals and nursing note reviewed.  Constitutional:      General: She is not in acute distress.    Appearance: She is well-developed.  HENT:     Head: Normocephalic and atraumatic.  Eyes:     Conjunctiva/sclera: Conjunctivae normal.  Cardiovascular:     Rate and Rhythm: Normal rate.     Pulses: Normal pulses.  Pulmonary:     Effort: Pulmonary effort is normal. No respiratory distress.  Musculoskeletal:     Cervical back: Neck supple.     Comments: LLE: There is a 3 to 4 cm diameter area of erythema over the medial aspect of her lower leg, punctate area of purulence that spontaneously drained during examination, no underlying induration or significant swelling appreciated,  distal pulses and sensation intact, no streaking erythema  Skin:    General: Skin is warm and dry.  Neurological:     Mental Status: She is alert.     Media Information         Document Information  Photos  Left lower leg  07/25/2020 19:12  Attached To:  Hospital Encounter on 07/25/20   Source Information  Kadeja Granada, Ellwood Dense, MD  [facility]      ED Results / Procedures / Treatments   Labs (all labs ordered are listed, but  only abnormal results are displayed) Labs Reviewed - No data to display  EKG None  Radiology No results found.  Procedures Procedures   Medications Ordered in ED Medications - No data to display  ED Course  I have reviewed the triage vital signs and the nursing notes.  Pertinent labs & imaging results that were available during my care of the patient were reviewed by me and considered in my medical decision making (see chart for details).    MDM Rules/Calculators/A&P                          60 year old lady presenting to ER with concern for cat bite.  On exam, there is noted to be a small area of erythema as well as a punctate area of purulence that spontaneously drained while examining the area.  There is no underlying induration or fluctuance or swelling to suggest deeper abscess.  Patient has no systemic symptoms.  She reports tetanus is up-to-date.  No joint involvement.  At present believe she is appropriate for a trial of outpatient antibiotics.  Discussed with Vira Browns.D. at [facility] urgent antibiotics infection, recommends Augmentin for now.  I had lengthy discussion with patient about anticipatory guidance regarding cat bite injuries.  Discussed best case scenario this will respond to oral antibiotics however these can worsen and ultimately require admission and IV antibiotics.  Patient demonstrated good understanding.  Further instructed patient to follow-up either with your primary care doctor or with orthopedic office  Tuesday for close recheck.  Return to ER for any worsening erythema/swelling/fever, etc.  After the discussed management above, the patient was determined to be safe for discharge.  The patient was in agreement with this plan and all questions regarding their care were answered.  ED return precautions were discussed and the patient will return to the ED with any significant worsening of condition.  Final Clinical Impression(s) / ED Diagnoses Final diagnoses:  Cat bite, initial encounter    Rx / DC Orders ED Discharge Orders         Ordered    amoxicillin-clavulanate (AUGMENTIN) 875-125 MG tablet  Every 12 hours        07/25/20 1919           Lucrezia Starch, MD 07/26/20 1050

## 2020-08-17 DIAGNOSIS — L309 Dermatitis, unspecified: Secondary | ICD-10-CM | POA: Diagnosis not present

## 2020-08-17 DIAGNOSIS — M25569 Pain in unspecified knee: Secondary | ICD-10-CM | POA: Diagnosis not present

## 2020-10-06 DIAGNOSIS — Z5181 Encounter for therapeutic drug level monitoring: Secondary | ICD-10-CM | POA: Diagnosis not present

## 2020-10-06 DIAGNOSIS — E782 Mixed hyperlipidemia: Secondary | ICD-10-CM | POA: Diagnosis not present

## 2020-10-11 DIAGNOSIS — Z0000 Encounter for general adult medical examination without abnormal findings: Secondary | ICD-10-CM | POA: Diagnosis not present

## 2021-01-03 DIAGNOSIS — L669 Cicatricial alopecia, unspecified: Secondary | ICD-10-CM | POA: Diagnosis not present

## 2021-01-03 DIAGNOSIS — L658 Other specified nonscarring hair loss: Secondary | ICD-10-CM | POA: Diagnosis not present

## 2021-01-06 DIAGNOSIS — F4320 Adjustment disorder, unspecified: Secondary | ICD-10-CM | POA: Diagnosis not present

## 2021-02-03 DIAGNOSIS — H524 Presbyopia: Secondary | ICD-10-CM | POA: Diagnosis not present

## 2021-02-03 DIAGNOSIS — H02831 Dermatochalasis of right upper eyelid: Secondary | ICD-10-CM | POA: Diagnosis not present

## 2021-02-03 DIAGNOSIS — H2513 Age-related nuclear cataract, bilateral: Secondary | ICD-10-CM | POA: Diagnosis not present

## 2021-02-03 DIAGNOSIS — H35362 Drusen (degenerative) of macula, left eye: Secondary | ICD-10-CM | POA: Diagnosis not present

## 2021-02-03 DIAGNOSIS — H02834 Dermatochalasis of left upper eyelid: Secondary | ICD-10-CM | POA: Diagnosis not present

## 2021-02-04 DIAGNOSIS — Z1231 Encounter for screening mammogram for malignant neoplasm of breast: Secondary | ICD-10-CM | POA: Diagnosis not present

## 2021-02-04 DIAGNOSIS — Z780 Asymptomatic menopausal state: Secondary | ICD-10-CM | POA: Diagnosis not present

## 2021-02-08 DIAGNOSIS — R509 Fever, unspecified: Secondary | ICD-10-CM | POA: Diagnosis not present

## 2021-02-08 DIAGNOSIS — Z03818 Encounter for observation for suspected exposure to other biological agents ruled out: Secondary | ICD-10-CM | POA: Diagnosis not present

## 2021-02-08 DIAGNOSIS — J019 Acute sinusitis, unspecified: Secondary | ICD-10-CM | POA: Diagnosis not present

## 2021-03-08 DIAGNOSIS — F4320 Adjustment disorder, unspecified: Secondary | ICD-10-CM | POA: Diagnosis not present

## 2021-05-12 DIAGNOSIS — E782 Mixed hyperlipidemia: Secondary | ICD-10-CM | POA: Diagnosis not present

## 2021-05-12 DIAGNOSIS — G43809 Other migraine, not intractable, without status migrainosus: Secondary | ICD-10-CM | POA: Diagnosis not present

## 2021-05-12 DIAGNOSIS — E2839 Other primary ovarian failure: Secondary | ICD-10-CM | POA: Diagnosis not present

## 2021-07-27 DIAGNOSIS — R102 Pelvic and perineal pain: Secondary | ICD-10-CM | POA: Diagnosis not present

## 2021-07-27 DIAGNOSIS — N951 Menopausal and female climacteric states: Secondary | ICD-10-CM | POA: Diagnosis not present

## 2021-07-27 DIAGNOSIS — N950 Postmenopausal bleeding: Secondary | ICD-10-CM | POA: Diagnosis not present

## 2021-08-03 DIAGNOSIS — N950 Postmenopausal bleeding: Secondary | ICD-10-CM | POA: Diagnosis not present

## 2021-08-03 DIAGNOSIS — L237 Allergic contact dermatitis due to plants, except food: Secondary | ICD-10-CM | POA: Diagnosis not present

## 2021-08-08 DIAGNOSIS — L237 Allergic contact dermatitis due to plants, except food: Secondary | ICD-10-CM | POA: Diagnosis not present

## 2021-09-06 DIAGNOSIS — F411 Generalized anxiety disorder: Secondary | ICD-10-CM | POA: Diagnosis not present

## 2021-09-13 DIAGNOSIS — F411 Generalized anxiety disorder: Secondary | ICD-10-CM | POA: Diagnosis not present

## 2021-09-21 DIAGNOSIS — F411 Generalized anxiety disorder: Secondary | ICD-10-CM | POA: Diagnosis not present

## 2021-09-29 DIAGNOSIS — L089 Local infection of the skin and subcutaneous tissue, unspecified: Secondary | ICD-10-CM | POA: Diagnosis not present

## 2021-09-29 DIAGNOSIS — L658 Other specified nonscarring hair loss: Secondary | ICD-10-CM | POA: Diagnosis not present

## 2021-09-29 DIAGNOSIS — L669 Cicatricial alopecia, unspecified: Secondary | ICD-10-CM | POA: Diagnosis not present

## 2021-10-07 DIAGNOSIS — F411 Generalized anxiety disorder: Secondary | ICD-10-CM | POA: Diagnosis not present

## 2021-10-18 DIAGNOSIS — F411 Generalized anxiety disorder: Secondary | ICD-10-CM | POA: Diagnosis not present

## 2021-11-02 DIAGNOSIS — F411 Generalized anxiety disorder: Secondary | ICD-10-CM | POA: Diagnosis not present

## 2021-11-14 DIAGNOSIS — E782 Mixed hyperlipidemia: Secondary | ICD-10-CM | POA: Diagnosis not present

## 2021-11-14 DIAGNOSIS — Z79899 Other long term (current) drug therapy: Secondary | ICD-10-CM | POA: Diagnosis not present

## 2021-11-15 DIAGNOSIS — F411 Generalized anxiety disorder: Secondary | ICD-10-CM | POA: Diagnosis not present

## 2021-11-17 ENCOUNTER — Other Ambulatory Visit: Payer: Self-pay | Admitting: Family Medicine

## 2021-11-17 DIAGNOSIS — E782 Mixed hyperlipidemia: Secondary | ICD-10-CM

## 2021-11-17 DIAGNOSIS — Z23 Encounter for immunization: Secondary | ICD-10-CM | POA: Diagnosis not present

## 2021-11-17 DIAGNOSIS — Z0000 Encounter for general adult medical examination without abnormal findings: Secondary | ICD-10-CM | POA: Diagnosis not present

## 2021-11-18 ENCOUNTER — Other Ambulatory Visit (HOSPITAL_BASED_OUTPATIENT_CLINIC_OR_DEPARTMENT_OTHER): Payer: Self-pay | Admitting: Family Medicine

## 2021-11-18 ENCOUNTER — Other Ambulatory Visit (HOSPITAL_COMMUNITY): Payer: Self-pay | Admitting: Family Medicine

## 2021-11-18 DIAGNOSIS — E782 Mixed hyperlipidemia: Secondary | ICD-10-CM

## 2021-11-30 DIAGNOSIS — F411 Generalized anxiety disorder: Secondary | ICD-10-CM | POA: Diagnosis not present

## 2021-12-05 ENCOUNTER — Ambulatory Visit (HOSPITAL_COMMUNITY): Payer: Federal, State, Local not specified - PPO

## 2021-12-13 DIAGNOSIS — F411 Generalized anxiety disorder: Secondary | ICD-10-CM | POA: Diagnosis not present

## 2021-12-19 ENCOUNTER — Ambulatory Visit (HOSPITAL_COMMUNITY)
Admission: RE | Admit: 2021-12-19 | Discharge: 2021-12-19 | Disposition: A | Discharge status: Home / Self Care | Payer: Federal, State, Local not specified - PPO | Source: Ambulatory Visit | Attending: Family Medicine | Admitting: Family Medicine

## 2021-12-19 DIAGNOSIS — E782 Mixed hyperlipidemia: Secondary | ICD-10-CM | POA: Insufficient documentation

## 2021-12-21 DIAGNOSIS — N950 Postmenopausal bleeding: Secondary | ICD-10-CM | POA: Diagnosis not present

## 2021-12-21 DIAGNOSIS — R102 Pelvic and perineal pain: Secondary | ICD-10-CM | POA: Diagnosis not present

## 2021-12-21 DIAGNOSIS — D259 Leiomyoma of uterus, unspecified: Secondary | ICD-10-CM | POA: Diagnosis not present

## 2021-12-27 DIAGNOSIS — F411 Generalized anxiety disorder: Secondary | ICD-10-CM | POA: Diagnosis not present

## 2022-01-02 ENCOUNTER — Telehealth: Payer: Self-pay | Admitting: *Deleted

## 2022-01-02 NOTE — Patient Outreach (Signed)
  Care Coordination   01/02/2022 Name: Erica Reilly MRN: [id number] DOB: [birth year]   Care Coordination Outreach Attempts:  An unsuccessful telephone outreach was attempted today to offer the patient information about available care coordination services as a benefit of their health plan.   Follow Up Plan:  Additional outreach attempts will be made to offer the patient care coordination information and services.   Encounter Outcome:  No Answer  Care Coordination Interventions Activated:  No   Care Coordination Interventions:  No, not indicated    Raina Mina, RN Care Management Coordinator [facility] Office [phone]

## 2022-01-03 DIAGNOSIS — N950 Postmenopausal bleeding: Secondary | ICD-10-CM | POA: Diagnosis not present

## 2022-01-03 DIAGNOSIS — D259 Leiomyoma of uterus, unspecified: Secondary | ICD-10-CM | POA: Diagnosis not present

## 2022-01-03 DIAGNOSIS — R102 Pelvic and perineal pain: Secondary | ICD-10-CM | POA: Diagnosis not present

## 2022-01-09 DIAGNOSIS — F411 Generalized anxiety disorder: Secondary | ICD-10-CM | POA: Diagnosis not present

## 2022-01-17 DIAGNOSIS — Z01411 Encounter for gynecological examination (general) (routine) with abnormal findings: Secondary | ICD-10-CM | POA: Diagnosis not present

## 2022-01-17 DIAGNOSIS — Z1239 Encounter for other screening for malignant neoplasm of breast: Secondary | ICD-10-CM | POA: Diagnosis not present

## 2022-01-17 DIAGNOSIS — Z6826 Body mass index (BMI) 26.0-26.9, adult: Secondary | ICD-10-CM | POA: Diagnosis not present

## 2022-01-17 DIAGNOSIS — N950 Postmenopausal bleeding: Secondary | ICD-10-CM | POA: Diagnosis not present

## 2022-02-09 DIAGNOSIS — H02831 Dermatochalasis of right upper eyelid: Secondary | ICD-10-CM | POA: Diagnosis not present

## 2022-02-09 DIAGNOSIS — H524 Presbyopia: Secondary | ICD-10-CM | POA: Diagnosis not present

## 2022-02-09 DIAGNOSIS — H2513 Age-related nuclear cataract, bilateral: Secondary | ICD-10-CM | POA: Diagnosis not present

## 2022-02-09 DIAGNOSIS — H02834 Dermatochalasis of left upper eyelid: Secondary | ICD-10-CM | POA: Diagnosis not present

## 2022-02-10 DIAGNOSIS — Z1231 Encounter for screening mammogram for malignant neoplasm of breast: Secondary | ICD-10-CM | POA: Diagnosis not present

## 2022-02-16 DIAGNOSIS — Z30430 Encounter for insertion of intrauterine contraceptive device: Secondary | ICD-10-CM | POA: Diagnosis not present

## 2022-02-16 DIAGNOSIS — Z113 Encounter for screening for infections with a predominantly sexual mode of transmission: Secondary | ICD-10-CM | POA: Diagnosis not present

## 2022-02-27 DIAGNOSIS — R931 Abnormal findings on diagnostic imaging of heart and coronary circulation: Secondary | ICD-10-CM | POA: Insufficient documentation

## 2022-02-27 HISTORY — DX: Abnormal findings on diagnostic imaging of heart and coronary circulation: R93.1

## 2022-02-28 DIAGNOSIS — F411 Generalized anxiety disorder: Secondary | ICD-10-CM | POA: Diagnosis not present

## 2022-03-14 DIAGNOSIS — Z30430 Encounter for insertion of intrauterine contraceptive device: Secondary | ICD-10-CM | POA: Diagnosis not present

## 2022-03-22 ENCOUNTER — Ambulatory Visit: Payer: Federal, State, Local not specified - PPO | Attending: Cardiology | Admitting: Cardiology

## 2022-03-22 ENCOUNTER — Encounter: Payer: Self-pay | Admitting: Cardiology

## 2022-03-22 VITALS — BP 114/70 | HR 89 | Ht 65.0 in | Wt 163.0 lb

## 2022-03-22 DIAGNOSIS — R931 Abnormal findings on diagnostic imaging of heart and coronary circulation: Secondary | ICD-10-CM

## 2022-03-22 DIAGNOSIS — E7800 Pure hypercholesterolemia, unspecified: Secondary | ICD-10-CM

## 2022-03-22 NOTE — Progress Notes (Signed)
Cardiology CONSULT Note    Date:  03/22/2022   ID:  Erica Reilly, DOB Sep 03, 1960, MRN [id number]  PCP:  Maurice Small, MD  Cardiologist:  Fransico Him, MD   Chief Complaint  Patient presents with   New Patient (Initial Visit)    Coronary artery calcifications    History of Present Illness:  Erica Reilly is a 62 y.o. female who is being seen today for the evaluation of coronary artery calcifications at the request of Glenis Smoker, *.  This is a 62 year old female with a history of chronic leukopenia, allergic rhinitis who underwent coronary calcium scoring by her PCP.  This revealed a coronary calcium score 36.7 with which was 80th percentile for age, race and sex matched controls.  This was all located in the LM/LAD.  She denies any chest pain or pressure, SOB, DOE, PND, orthopnea, LE edema, dizziness, palpitations or syncope. She is compliant with her meds and is tolerating meds with no SE.  She exercises 4 times weekly with aerobics and weight training with no cardiac sx.   Past Medical History:  Diagnosis Date   Agatston coronary artery calcium score less than 100 02/2022   coronary calcium score 36.7 with which was 80th percentile for age, race and sex matched controls.  This was all located in the LM/LAD.   Allergic rhinitis, mild    Arthritis    knees and back   Hepatitis as infant   no problem since   LEUKOPENIA, CHRONIC    2019 - patient unsure of this diagnosis   Migraine    Ovarian cyst    right    Past Surgical History:  Procedure Laterality Date   COLONOSCOPY     2003 2008 2019   HAMMER TOE SURGERY Bilateral yrs ago   KNEE ARTHROSCOPY Right 2018   TONSILLECTOMY  age 39 or 22   XI ROBOTIC ASSISTED OOPHORECTOMY Bilateral 12/09/2019   Procedure: XI ROBOTIC ASSISTED RIGHT SALPINGO-OOPHORECTOMY, LEFT SALPINGECTOMY  WITH PELVIC WASHINGS;  Surgeon: Delsa Bern, MD;  Location: [facility];  Service: Gynecology;  Laterality: Bilateral;     Current Medications: Current Meds  Medication Sig   acyclovir (ZOVIRAX) 400 MG tablet TAKE 1 TABLET BY MOUTH THREE TIMES DAILY FOR 5 TO 7 DAYS AS NEEDED FOR OUTBREAK   estradiol (EVAMIST) 1.53 MG/SPRAY transdermal spray Place 2 sprays onto the skin daily.   finasteride (PROSCAR) 5 MG tablet at bedtime. 2.5 mg daily   glucosamine-chondroitin 500-400 MG tablet Take 1 tablet by mouth in the morning and at bedtime.    minoxidil (ROGAINE) 2 % external solution Apply topically 2 (two) times daily.   Multiple Vitamin (MULTIVITAMIN) capsule Take 1 capsule by mouth in the morning and at bedtime.    Omega-3 Fatty Acids ([employer]S FISH OIL PO) in the morning and at bedtime.    progesterone (PROMETRIUM) 100 MG capsule Take 1 capsule (100 mg total) by mouth daily.   rosuvastatin (CRESTOR) 10 MG tablet Take 1 tablet by mouth daily.   SUMAtriptan (IMITREX) 25 MG tablet Take 25 mg by mouth every 2 (two) hours as needed for migraine. May repeat in 2 hours if headache persists or recurs.    Allergies:   Patient has no known allergies.   Social History   Socioeconomic History   Marital status: Married    Spouse name: Not on file   Number of children: Not on file   Years of education: Not on file  Highest education level: Not on file  Occupational History   Not on file  Tobacco Use   Smoking status: Never   Smokeless tobacco: Never  Vaping Use   Vaping Use: Never used  Substance and Sexual Activity   Alcohol use: Yes    Comment: occ   Drug use: No   Sexual activity: Yes  Other Topics Concern   Not on file  Social History Narrative   Lives with husband   Works at the [employer].   Exercises on regular basis; Eating healthy   Christian   Social Determinants of Health   Financial Resource Strain: Not on file  Food Insecurity: Not on file  Transportation Needs: Not on file  Physical Activity: Not on file  Stress: Not on file  Social Connections: Not on file     Family History:  The  patient's family history includes Arthritis in her mother; Brain cancer in her father; Colon cancer in her father; Diabetes in her mother; Heart murmur in her brother; Hypertension in her mother and sister; Prostate cancer in her father.   ROS:   Please see the history of present illness.    ROS All other systems reviewed and are negative.      No data to display             PHYSICAL EXAM:   VS:  BP 114/70   Pulse 89   Ht '5\' 5"'$  (1.651 m)   Wt 163 lb (73.9 kg)   SpO2 96%   BMI 27.12 kg/m    GEN: Well nourished, well developed, in no acute distress  HEENT: normal  Neck: no JVD, carotid bruits, or masses Cardiac: RRR; no murmurs, rubs, or gallops,no edema.  Intact distal pulses bilaterally.  Respiratory:  clear to auscultation bilaterally, normal work of breathing GI: soft, nontender, nondistended, + BS MS: no deformity or atrophy  Skin: warm and dry, no rash Neuro:  Alert and Oriented x 3, Strength and sensation are intact Psych: euthymic mood, full affect  Wt Readings from Last 3 Encounters:  03/22/22 163 lb (73.9 kg)  07/25/20 150 lb (68 kg)  12/09/19 152 lb 11.2 oz (69.3 kg)      Studies/Labs Reviewed:   EKG:  EKG is ordered today.  The ekg ordered today demonstrates NSR with RAE and nonspecific T wave abnormality  Recent Labs: No results found for requested labs within last 365 days.   Lipid Panel    Component Value Date/Time   CHOL 195 12/23/2014 0753   TRIG 46.0 12/23/2014 0753   HDL 66.80 12/23/2014 0753   CHOLHDL 3 12/23/2014 0753   VLDL 9.2 12/23/2014 0753   LDLCALC 119 (H) 12/23/2014 0753     Additional studies/ records that were reviewed today include:  Coronary calcium score    ASSESSMENT:    1. Agatston coronary artery calcium score less than 100   2. Pure hypercholesterolemia      PLAN:  In order of problems listed above:  Coronary calcifications -coronary calcium score 36.7 with which was 80th percentile for age, race and sex  matched controls.  This was all located in the LM/LAD. -She is completely asymptomatic and denies any chest pain or pressure, shortness of breath, dyspnea on exertion. -She needs aggressive risk factor modification. -Start aspirin 81 mg daily -she has been on Crestor '10mg'$  daily  -Check hemoglobin A1c  2.  Hyperlipidemia -LDL goal less than 70 due to coronary calcifications -I have personally reviewed and interpreted  outside labs performed by patient's PCP which showed LDL 142 and HDL 85 on 03/16/2021 -continue Crestor '10mg'$  daily with PRN refills -Repeat FLP and ALT -encouraged her to follow a Mediterranean diet and cut back on red meat and fat -continue to exercise   Time Spent: 20 minutes total time of encounter, including 15 minutes spent in face-to-face patient care on the date of this encounter. This time includes coordination of care and counseling regarding above mentioned problem list. Remainder of non-face-to-face time involved reviewing chart documents/testing relevant to the patient encounter and documentation in the medical record. I have independently reviewed documentation from referring provider  Medication Adjustments/Labs and Tests Ordered: Current medicines are reviewed at length with the patient today.  Concerns regarding medicines are outlined above.  Medication changes, Labs and Tests ordered today are listed in the Patient Instructions below.  There are no Patient Instructions on file for this visit.   Signed, Fransico Him, MD  03/22/2022 2:02 PM    [facility] [address], [place], [place]  [address] Phone: [phone]; Fax: [phone]

## 2022-03-22 NOTE — Addendum Note (Signed)
Addended by: Joni Reining on: 03/22/2022 02:21 PM   Modules accepted: Orders

## 2022-03-22 NOTE — Patient Instructions (Signed)
Medication Instructions:  Your physician recommends that you continue on your current medications as directed. Please refer to the Current Medication list given to you today.  *If you need a refill on your cardiac medications before your next appointment, please call your pharmacy*   Lab Work: Please schedule at day to come in and have your FASTING labs done. You need a FASTING lipid panel, and ALT, and a HGB A1c.  If you have labs (blood work) drawn today and your tests are completely normal, you will receive your results only by: [facility] (if you have MyChart) OR A paper copy in the mail If you have any lab test that is abnormal or we need to change your treatment, we will call you to review the results.   Testing/Procedures: None.   Follow-Up: At [facility], you and your health needs are our priority.  As part of our continuing mission to provide you with exceptional heart care, we have created designated Provider Care Teams.  These Care Teams include your primary Cardiologist (physician) and Advanced Practice Providers (APPs -  Physician Assistants and Nurse Practitioners) who all work together to provide you with the care you need, when you need it.  We recommend signing up for the patient portal called "MyChart".  Sign up information is provided on this After Visit Summary.  MyChart is used to connect with patients for Virtual Visits (Telemedicine).  Patients are able to view lab/test results, encounter notes, upcoming appointments, etc.  Non-urgent messages can be sent to your provider as well.   To learn more about what you can do with MyChart, go to [email or web address].    Your next appointment:   1 year(s)  Provider:   Dr. Fransico Him, MD

## 2022-03-24 NOTE — Addendum Note (Signed)
Addended by: Janan Halter F on: 03/24/2022 09:07 AM   Modules accepted: Orders

## 2022-03-27 ENCOUNTER — Ambulatory Visit: Payer: Federal, State, Local not specified - PPO | Attending: Cardiology

## 2022-03-27 DIAGNOSIS — E7800 Pure hypercholesterolemia, unspecified: Secondary | ICD-10-CM

## 2022-03-28 DIAGNOSIS — Z30431 Encounter for routine checking of intrauterine contraceptive device: Secondary | ICD-10-CM | POA: Diagnosis not present

## 2022-03-28 LAB — LIPID PANEL
Chol/HDL Ratio: 2 ratio (ref 0.0–4.4)
Cholesterol, Total: 187 mg/dL (ref 100–199)
HDL: 92 mg/dL (ref >39)
LDL Chol Calc (NIH): 88 mg/dL (ref 0–99)
Triglycerides: 32 mg/dL (ref 0–149)
VLDL Cholesterol Cal: 7 mg/dL (ref 5–40)

## 2022-03-28 LAB — HEMOGLOBIN A1C
Est. average glucose Bld gHb Est-mCnc: 120 mg/dL
Hgb A1c MFr Bld: 5.8 % — ABNORMAL HIGH (ref 4.8–5.6)

## 2022-03-28 LAB — ALT: ALT: 18 IU/L (ref 0–32)

## 2022-03-31 DIAGNOSIS — F411 Generalized anxiety disorder: Secondary | ICD-10-CM | POA: Diagnosis not present

## 2022-04-03 ENCOUNTER — Telehealth: Payer: Self-pay

## 2022-04-03 NOTE — Addendum Note (Signed)
Addended by: Callie Fielding D on: 04/03/2022 11:44 AM   Modules accepted: Orders

## 2022-04-03 NOTE — Telephone Encounter (Signed)
-----   Message from Sueanne Margarita, MD sent at 03/31/2022  7:54 PM EST ----- Repeat labs in 3 momths  ----- Message ----- From: Gershon Crane, LPN Sent: 09/30/7205   4:31 PM EST To: Sueanne Margarita, MD   ----- Message ----- From: Sueanne Margarita, MD Sent: 03/28/2022  10:23 AM EST To: Gershon Crane, LPN  Because her LDL needs to be less than 70 and even though the LDL is decreasing it is not at goal so we need to increase the Crestor further ----- Message ----- From: Gershon Crane, LPN Sent: 04/16/2881  10:03 AM EST To: Sueanne Margarita, MD  Patient will like clarification on why should did her dosage of Crestor increase even though her LDL is decreasing.

## 2022-04-03 NOTE — Telephone Encounter (Signed)
Patient is scheduled for repeat labs on 06/26/22. Patient voiced understanding.

## 2022-04-05 ENCOUNTER — Telehealth: Payer: Self-pay

## 2022-04-05 NOTE — Telephone Encounter (Signed)
Patient requesting FLP/Alt in 3 months rather than 6 weeks, discussed with Dr. Radford Pax, scheduled for April. All labs forwarded to PCP.

## 2022-04-05 NOTE — Telephone Encounter (Signed)
-----   Message from Sueanne Margarita, MD sent at 03/31/2022  7:54 PM EST ----- Repeat FLp in 3 months ----- Message ----- From: Gershon Crane, LPN Sent: 08/01/8004   4:31 PM EST To: Sueanne Margarita, MD   ----- Message ----- From: Sueanne Margarita, MD Sent: 03/28/2022  10:23 AM EST To: Gershon Crane, LPN  Because her LDL needs to be less than 70 and even though the LDL is decreasing it is not at goal so we need to increase the Crestor further ----- Message ----- From: Gershon Crane, LPN Sent: 3/49/4944  10:03 AM EST To: Sueanne Margarita, MD  Patient will like clarification on why should did her dosage of Crestor increase even though her LDL is decreasing.

## 2022-05-15 DIAGNOSIS — F411 Generalized anxiety disorder: Secondary | ICD-10-CM | POA: Diagnosis not present

## 2022-06-25 DIAGNOSIS — J301 Allergic rhinitis due to pollen: Secondary | ICD-10-CM | POA: Diagnosis not present

## 2022-06-26 ENCOUNTER — Ambulatory Visit: Payer: Federal, State, Local not specified - PPO | Attending: Cardiology

## 2022-06-26 DIAGNOSIS — E7800 Pure hypercholesterolemia, unspecified: Secondary | ICD-10-CM | POA: Diagnosis not present

## 2022-06-27 LAB — LIPID PANEL
Chol/HDL Ratio: 2.1 ratio (ref 0.0–4.4)
Cholesterol, Total: 163 mg/dL (ref 100–199)
HDL: 79 mg/dL (ref >39)
LDL Chol Calc (NIH): 76 mg/dL (ref 0–99)
Triglycerides: 31 mg/dL (ref 0–149)
VLDL Cholesterol Cal: 8 mg/dL (ref 5–40)

## 2022-06-27 LAB — ALT: ALT: 18 IU/L (ref 0–32)

## 2022-06-28 DIAGNOSIS — F411 Generalized anxiety disorder: Secondary | ICD-10-CM | POA: Diagnosis not present

## 2022-06-29 ENCOUNTER — Telehealth: Payer: Self-pay

## 2022-06-29 DIAGNOSIS — Z79899 Other long term (current) drug therapy: Secondary | ICD-10-CM

## 2022-06-29 DIAGNOSIS — R931 Abnormal findings on diagnostic imaging of heart and coronary circulation: Secondary | ICD-10-CM

## 2022-06-29 MED ORDER — ROSUVASTATIN CALCIUM 20 MG PO TABS: 20.0000 mg | ORAL_TABLET | Freq: Every day | ORAL | 3 refills | Status: DC

## 2022-06-29 NOTE — Telephone Encounter (Signed)
Call to patient to review labs, LDL still not at goal. Patient verbalizes understanding and agrees to increase crestor to 20 mg daily. Labs also ordered and scheduled.

## 2022-06-29 NOTE — Telephone Encounter (Signed)
-----   Message from Quintella Reichert, MD sent at 06/28/2022  3:02 PM EDT ----- LDL still not at goal - increase Crestor to 20mg  daily and repeat FLP and ALTin 6 weeks

## 2022-07-09 DIAGNOSIS — K529 Noninfective gastroenteritis and colitis, unspecified: Secondary | ICD-10-CM | POA: Diagnosis not present

## 2022-07-11 DIAGNOSIS — F411 Generalized anxiety disorder: Secondary | ICD-10-CM | POA: Diagnosis not present

## 2022-07-18 DIAGNOSIS — Z1211 Encounter for screening for malignant neoplasm of colon: Secondary | ICD-10-CM | POA: Diagnosis not present

## 2022-07-18 DIAGNOSIS — Z800 Family history of malignant neoplasm of digestive organs: Secondary | ICD-10-CM | POA: Diagnosis not present

## 2022-07-25 DIAGNOSIS — L255 Unspecified contact dermatitis due to plants, except food: Secondary | ICD-10-CM | POA: Diagnosis not present

## 2022-08-07 DIAGNOSIS — F411 Generalized anxiety disorder: Secondary | ICD-10-CM | POA: Diagnosis not present

## 2022-08-08 ENCOUNTER — Ambulatory Visit: Payer: Federal, State, Local not specified - PPO | Attending: Cardiology

## 2022-08-08 DIAGNOSIS — Z79899 Other long term (current) drug therapy: Secondary | ICD-10-CM | POA: Diagnosis not present

## 2022-08-08 DIAGNOSIS — R931 Abnormal findings on diagnostic imaging of heart and coronary circulation: Secondary | ICD-10-CM | POA: Diagnosis not present

## 2022-08-09 LAB — LIPID PANEL
Chol/HDL Ratio: 2.1 ratio (ref 0.0–4.4)
Cholesterol, Total: 155 mg/dL (ref 100–199)
HDL: 75 mg/dL (ref >39)
LDL Chol Calc (NIH): 71 mg/dL (ref 0–99)
Triglycerides: 40 mg/dL (ref 0–149)
VLDL Cholesterol Cal: 9 mg/dL (ref 5–40)

## 2022-08-09 LAB — ALT: ALT: 15 IU/L (ref 0–32)

## 2022-08-21 DIAGNOSIS — F411 Generalized anxiety disorder: Secondary | ICD-10-CM | POA: Diagnosis not present

## 2022-09-13 DIAGNOSIS — F411 Generalized anxiety disorder: Secondary | ICD-10-CM | POA: Diagnosis not present

## 2022-10-12 DIAGNOSIS — L668 Other cicatricial alopecia: Secondary | ICD-10-CM | POA: Diagnosis not present

## 2022-10-12 DIAGNOSIS — L089 Local infection of the skin and subcutaneous tissue, unspecified: Secondary | ICD-10-CM | POA: Diagnosis not present

## 2022-10-16 ENCOUNTER — Encounter: Payer: Self-pay | Admitting: Podiatry

## 2022-10-16 ENCOUNTER — Ambulatory Visit: Payer: Federal, State, Local not specified - PPO | Admitting: Podiatry

## 2022-10-16 DIAGNOSIS — M722 Plantar fascial fibromatosis: Secondary | ICD-10-CM

## 2022-10-16 DIAGNOSIS — F411 Generalized anxiety disorder: Secondary | ICD-10-CM | POA: Diagnosis not present

## 2022-10-16 NOTE — Progress Notes (Signed)
   Chief Complaint  Patient presents with   Foot Orthotics    Rm 7: New patient re-establish care would like new orthotics    Subjective: 62 y.o. female presenting today as a reestablish new patient for evaluation of plantar fasciitis to the right foot which has been chronic.  She does have a history of intermittent episodes of acute flareups of plantar fasciitis.  She says orthotics in the past that helped significantly.  She presents for further treatment evaluation   Past Medical History:  Diagnosis Date   Agatston coronary artery calcium score less than 100 02/2022   coronary calcium score 36.7 with which was 80th percentile for age, race and sex matched controls.  This was all located in the LM/LAD.   Allergic rhinitis, mild    Arthritis    knees and back   Hepatitis as infant   no problem since   LEUKOPENIA, CHRONIC    2019 - patient unsure of this diagnosis   Migraine    Ovarian cyst    right     Objective: Physical Exam General: The patient is alert and oriented x3 in no acute distress.  Dermatology: Skin is warm, dry and supple bilateral lower extremities. Negative for open lesions or macerations bilateral.   Vascular: Dorsalis Pedis and Posterior Tibial pulses palpable bilateral.  Capillary fill time is immediate to all digits.  Neurological: Grossly intact via light touch  Musculoskeletal: Minimal tenderness to palpation to the plantar aspect of the right heel along the plantar fascia. All other joints range of motion within normal limits bilateral. Strength 5/5 in all groups bilateral.   Assessment: 1. Plantar fasciitis right; chronic  Plan of Care:  -Patient evaluated -The patient does well with orthotics to address her chronic plantar fasciitis especially to the right foot.  She has minimal flareups when she wears her orthotics -Appointment with orthotics department for new custom molded insoles to support the medial longitudinal arch of the foot and  offload pressure from the heel -Return to clinic as needed   Felecia Shelling, DPM [facility]  Dr. Felecia Shelling, DPM    [address] [place], [place] [address]                Office [phone]  Fax [phone]

## 2022-10-25 ENCOUNTER — Ambulatory Visit (INDEPENDENT_AMBULATORY_CARE_PROVIDER_SITE_OTHER): Payer: Federal, State, Local not specified - PPO

## 2022-10-25 DIAGNOSIS — M722 Plantar fascial fibromatosis: Secondary | ICD-10-CM

## 2022-10-25 NOTE — Progress Notes (Signed)
  Patient was seen, measured for custom molded foot orthotics HX of Plantar Fasciitis  Patient will benefit from CFO's as they will help provide total contact to MLA's helping to better distribute body weight across BIL feet greater reducing plantar pressure and pain and to also encourage FF and RF alignment  Patient was scanned items to be ordered and fit when in   [employer], CFo, CFm

## 2022-11-17 ENCOUNTER — Telehealth: Payer: Self-pay | Admitting: Cardiology

## 2022-11-17 DIAGNOSIS — Z79899 Other long term (current) drug therapy: Secondary | ICD-10-CM

## 2022-11-17 DIAGNOSIS — E7800 Pure hypercholesterolemia, unspecified: Secondary | ICD-10-CM

## 2022-11-17 NOTE — Telephone Encounter (Signed)
Patient is wanting to know if Dr. Mayford Knife would like labs performed prior to her 1 year follow up in February.   Please advise.

## 2022-11-21 DIAGNOSIS — F411 Generalized anxiety disorder: Secondary | ICD-10-CM | POA: Diagnosis not present

## 2022-11-23 DIAGNOSIS — Z79899 Other long term (current) drug therapy: Secondary | ICD-10-CM | POA: Diagnosis not present

## 2022-11-23 DIAGNOSIS — E782 Mixed hyperlipidemia: Secondary | ICD-10-CM | POA: Diagnosis not present

## 2022-11-24 NOTE — Telephone Encounter (Signed)
Call to patient to advise Dr. Mayford Knife says she can do FLP/ALT prior to visit on 04/02/22. No answer, left detailed message to let patient know I have scheduled her a lab visit on 03/25/22 and she can come complete fasting lipid panel and ALT. Advised she can also call our office to change to a better day if needed.

## 2022-11-27 DIAGNOSIS — Z0000 Encounter for general adult medical examination without abnormal findings: Secondary | ICD-10-CM | POA: Diagnosis not present

## 2022-12-07 ENCOUNTER — Telehealth: Payer: Self-pay | Admitting: Podiatry

## 2022-12-07 NOTE — Telephone Encounter (Signed)
Pt called checking on orthotic status. I did schedule her to come in and pick them up with you on 10/24 but she was thought she could just come pick them up since she has been wearing them for years. Please advise does she need the appt?

## 2022-12-08 NOTE — Telephone Encounter (Signed)
Notified pt that she did need the appt. We have a new company making them and Nicki Guadalajara wants to make sure they fit you well.

## 2022-12-21 ENCOUNTER — Ambulatory Visit: Payer: Federal, State, Local not specified - PPO

## 2022-12-21 ENCOUNTER — Other Ambulatory Visit: Payer: Federal, State, Local not specified - PPO

## 2022-12-21 NOTE — Progress Notes (Signed)
Patient presents today to pick up custom molded foot orthotics, diagnosed with PF by Dr. Logan Bores.   Orthotics were dispensed and fit was satisfactory. Reviewed instructions for break-in and wear. Written instructions given to patient.  Patient will follow up as needed.   Addison Bailey Cped, CFo, CFm

## 2023-01-09 ENCOUNTER — Telehealth: Payer: Self-pay | Admitting: Podiatry

## 2023-01-09 NOTE — Telephone Encounter (Signed)
Pt called and has contacted her insurance and they will cover a second pair of orthotics so she would like to proceed with ordering them but would like you to call her to discuss type of orthotic she is wanting ordered.  She also asked about next yr she is switching to [employer] advantage plan and wanted to know if they cover them. I explained that they do not cover the orthotics because it does not meet criteria to be covered by medicare. She was glad I knew information and said thank you.

## 2023-01-10 NOTE — Telephone Encounter (Signed)
Patient would like 3/4 length like she received from [place] prior she will be bringing an orthotic by to show me what she would like

## 2023-01-18 DIAGNOSIS — Z01411 Encounter for gynecological examination (general) (routine) with abnormal findings: Secondary | ICD-10-CM | POA: Diagnosis not present

## 2023-01-18 DIAGNOSIS — Z124 Encounter for screening for malignant neoplasm of cervix: Secondary | ICD-10-CM | POA: Diagnosis not present

## 2023-01-18 DIAGNOSIS — Z1339 Encounter for screening examination for other mental health and behavioral disorders: Secondary | ICD-10-CM | POA: Diagnosis not present

## 2023-01-19 DIAGNOSIS — F411 Generalized anxiety disorder: Secondary | ICD-10-CM | POA: Diagnosis not present

## 2023-01-23 DIAGNOSIS — M722 Plantar fascial fibromatosis: Secondary | ICD-10-CM | POA: Diagnosis not present

## 2023-01-23 NOTE — Telephone Encounter (Addendum)
Order placed for 3/4 length orthotics will call when patient will need fitting  Erica Reilly Cped, CFo, Cfm  Need to add charges

## 2023-02-02 DIAGNOSIS — H524 Presbyopia: Secondary | ICD-10-CM | POA: Diagnosis not present

## 2023-02-02 DIAGNOSIS — H02834 Dermatochalasis of left upper eyelid: Secondary | ICD-10-CM | POA: Diagnosis not present

## 2023-02-02 DIAGNOSIS — H02831 Dermatochalasis of right upper eyelid: Secondary | ICD-10-CM | POA: Diagnosis not present

## 2023-02-02 DIAGNOSIS — H2513 Age-related nuclear cataract, bilateral: Secondary | ICD-10-CM | POA: Diagnosis not present

## 2023-02-14 DIAGNOSIS — Z1231 Encounter for screening mammogram for malignant neoplasm of breast: Secondary | ICD-10-CM | POA: Diagnosis not present

## 2023-03-16 ENCOUNTER — Ambulatory Visit: Payer: Federal, State, Local not specified - PPO | Admitting: Cardiology

## 2023-03-26 ENCOUNTER — Other Ambulatory Visit: Payer: Federal, State, Local not specified - PPO

## 2023-03-26 DIAGNOSIS — E7800 Pure hypercholesterolemia, unspecified: Secondary | ICD-10-CM

## 2023-03-26 DIAGNOSIS — Z79899 Other long term (current) drug therapy: Secondary | ICD-10-CM

## 2023-03-29 ENCOUNTER — Ambulatory Visit: Payer: Federal, State, Local not specified - PPO | Admitting: Cardiology

## 2023-03-29 LAB — LIPID PANEL
Chol/HDL Ratio: 2.2 {ratio} (ref 0.0–4.4)
Cholesterol, Total: 160 mg/dL (ref 100–199)
HDL: 74 mg/dL (ref >39)
LDL Chol Calc (NIH): 78 mg/dL (ref 0–99)
Triglycerides: 31 mg/dL (ref 0–149)
VLDL Cholesterol Cal: 8 mg/dL (ref 5–40)

## 2023-03-29 LAB — ALT: ALT: 27 [IU]/L (ref 0–32)

## 2023-04-02 ENCOUNTER — Telehealth: Payer: Self-pay

## 2023-04-02 NOTE — Telephone Encounter (Signed)
Call to patient to discuss what dose of crestor she is on and if she is still taking. Patient states she is still taking it, but does not recall dose. Patient with appt w/ Dr. Mayford Knife tomorrow, asks to discuss with Dr. Mayford Knife then.

## 2023-04-02 NOTE — Telephone Encounter (Signed)
-----   Message from Armanda Magic sent at 03/30/2023  7:45 AM EST ----- LDL still not at goal - it has listed on meds that she is on Crestor please find out if she is taking it and what dose

## 2023-04-03 ENCOUNTER — Ambulatory Visit: Payer: Federal, State, Local not specified - PPO | Attending: Cardiology | Admitting: Cardiology

## 2023-04-03 ENCOUNTER — Encounter: Payer: Self-pay | Admitting: Cardiology

## 2023-04-03 VITALS — BP 110/70 | HR 71 | Ht 64.5 in | Wt 157.2 lb

## 2023-04-03 DIAGNOSIS — Z79899 Other long term (current) drug therapy: Secondary | ICD-10-CM | POA: Diagnosis not present

## 2023-04-03 DIAGNOSIS — R931 Abnormal findings on diagnostic imaging of heart and coronary circulation: Secondary | ICD-10-CM

## 2023-04-03 DIAGNOSIS — E7800 Pure hypercholesterolemia, unspecified: Secondary | ICD-10-CM

## 2023-04-03 NOTE — Patient Instructions (Signed)
Medication Instructions:  Your physician recommends that you continue on your current medications as directed. Please refer to the Current Medication list given to you today.  *If you need a refill on your cardiac medications before your next appointment, please call your pharmacy*   Lab Work: Please complete a FASTING lipid panel and an ALT at any [facility] in June 2026.   If you have labs (blood work) drawn today and your tests are completely normal, you will receive your results only by: MyChart Message (if you have MyChart) OR A paper copy in the mail If you have any lab test that is abnormal or we need to change your treatment, we will call you to review the results.   Testing/Procedures: None.   Follow-Up: Your next appointment:   1 year(s)  Provider:   Dr. Armanda Magic, MD

## 2023-04-03 NOTE — Progress Notes (Signed)
 Cardiology Note    Date:  04/03/2023   ID:  Erica Reilly, DOB [birth year], MRN [id number]  PCP:  Erica Ivanoff, MD  Cardiologist:  Erica Bihari, MD   No chief complaint on file.   History of Present Illness:  Erica Reilly is a 63 y.o. female  with a history of chronic leukopenia, allergic rhinitis who underwent coronary calcium  scoring by her PCP.  This revealed a coronary calcium  score 36.7 with which was 80th percentile for age, race and sex matched controls.  This was all located in the LM/LAD.  She is here today for followup and is doing well.  She denies any chest pain or pressure, SOB, DOE, PND, orthopnea, LE edema, dizziness, palpitations or syncope. She is compliant with her meds and is tolerating meds with no SE.    Past Medical History:  Diagnosis Date  . Agatston coronary artery calcium  score less than 100 02/2022   coronary calcium  score 36.7 with which was 80th percentile for age, race and sex matched controls.  This was all located in the LM/LAD.  SABRA Allergic rhinitis, mild   . Arthritis    knees and back  . Hepatitis as infant   no problem since  . LEUKOPENIA, CHRONIC    2019 - patient unsure of this diagnosis  . Migraine   . Ovarian cyst    right    Past Surgical History:  Procedure Laterality Date  . COLONOSCOPY     2003 2008 2019  . HAMMER TOE SURGERY Bilateral yrs ago  . KNEE ARTHROSCOPY Right 2018  . TONSILLECTOMY  age 14 or 68  . XI ROBOTIC ASSISTED OOPHORECTOMY Bilateral 12/09/2019   Procedure: XI ROBOTIC ASSISTED RIGHT SALPINGO-OOPHORECTOMY, LEFT SALPINGECTOMY  WITH PELVIC WASHINGS;  Surgeon: Darcel Pool, MD;  Location: [facility];  Service: Gynecology;  Laterality: Bilateral;    Current Medications: Current Meds  Medication Sig  . acyclovir (ZOVIRAX) 400 MG tablet TAKE 1 TABLET BY MOUTH THREE TIMES DAILY FOR 5 TO 7 DAYS AS NEEDED FOR OUTBREAK  . finasteride (PROSCAR) 5 MG tablet at bedtime. 2.5 mg daily  . fluocinonide  ointment (LIDEX) 0.05 % Apply topically as needed.  SABRA glucosamine-chondroitin 500-400 MG tablet Take 1 tablet by mouth in the morning and at bedtime.   . minoxidil (ROGAINE) 2 % external solution Apply topically 2 (two) times daily.  . Multiple Vitamin (MULTIVITAMIN) capsule Take 1 capsule by mouth in the morning and at bedtime.   . Omega-3 Fatty Acids ([employer] FISH OIL PO) in the morning and at bedtime.   . SUMAtriptan (IMITREX) 25 MG tablet Take 25 mg by mouth every 2 (two) hours as needed for migraine. May repeat in 2 hours if headache persists or recurs.  . [DISCONTINUED] estradiol  (EVAMIST ) 1.53 MG/SPRAY transdermal spray Place 2 sprays onto the skin daily.  . [DISCONTINUED] progesterone  (PROMETRIUM ) 100 MG capsule Take 1 capsule (100 mg total) by mouth daily.  . estradiol  (VIVELLE -DOT) 0.1 MG/24HR patch Place 1 patch onto the skin 2 (two) times a week.    Allergies:   Patient has no known allergies.   Social History   Socioeconomic History  . Marital status: Married    Spouse name: Not on file  . Number of children: Not on file  . Years of education: Not on file  . Highest education level: Not on file  Occupational History  . Not on file  Tobacco Use  . Smoking status: Never  .  Smokeless tobacco: Never  Vaping Use  . Vaping status: Never Used  Substance and Sexual Activity  . Alcohol use: Yes    Comment: occ  . Drug use: No  . Sexual activity: Yes  Other Topics Concern  . Not on file  Social History Narrative   Lives with husband   Works at the [employer].   Exercises on regular basis; Eating healthy   Christian   Social Drivers of Health   Financial Resource Strain: Not on file  Food Insecurity: Not on file  Transportation Needs: Not on file  Physical Activity: Not on file  Stress: Not on file  Social Connections: Not on file     Family History:  The patient's family history includes Arthritis in her mother; Brain cancer in her father; Colon cancer in her  father; Diabetes in her mother; Heart murmur in her brother; Hypertension in her mother and sister; Prostate cancer in her father.   ROS:   Please see the history of present illness.    ROS All other systems reviewed and are negative.      No data to display             PHYSICAL EXAM:   VS:  BP 110/70   Pulse 71   Ht 5' 4.5 (1.638 m)   Wt 157 lb 3.2 oz (71.3 kg)   SpO2 96%   BMI 26.57 kg/m    GEN: Well nourished, well developed in no acute distress HEENT: Normal NECK: No JVD; No carotid bruits LYMPHATICS: No lymphadenopathy CARDIAC:RRR, no murmurs, rubs, gallops RESPIRATORY:  Clear to auscultation without rales, wheezing or rhonchi  ABDOMEN: Soft, non-tender, non-distended MUSCULOSKELETAL:  No edema; No deformity  SKIN: Warm and dry NEUROLOGIC:  Alert and oriented x 3 PSYCHIATRIC:  Normal affect  Wt Readings from Last 3 Encounters:  04/03/23 157 lb 3.2 oz (71.3 kg)  03/22/22 163 lb (73.9 kg)  07/25/20 150 lb (68 kg)      Studies/Labs Reviewed:   EKG Interpretation Date/Time:  Tuesday April 03 2023 11:44:38 EST Ventricular Rate:  71 PR Interval:  208 QRS Duration:  94 QT Interval:  380 QTC Calculation: 412 R Axis:   91  Text Interpretation: Normal sinus rhythm Right atrial enlargement Rightward axis No previous ECGs available Confirmed by Shlomo Corning ([id number]) on 04/03/2023 11:55:21 AM    Recent Labs: 03/29/2023: ALT 27   Lipid Panel    Component Value Date/Time   CHOL 160 03/29/2023 0833   TRIG 31 03/29/2023 0833   HDL 74 03/29/2023 0833   CHOLHDL 2.2 03/29/2023 0833   CHOLHDL 3 12/23/2014 0753   VLDL 9.2 12/23/2014 0753   LDLCALC 78 03/29/2023 0833     Additional studies/ records that were reviewed today include:  Coronary calcium  score    ASSESSMENT:    1. Agatston coronary artery calcium  score less than 100   2. Pure hypercholesterolemia      PLAN:  In order of problems listed above:  Coronary calcifications -coronary calcium   score 36.7 with which was 80th percentile for age, race and sex matched controls.  This was all located in the LM/LAD. -She remains asymptomatic -continue aggressive risk factor modification. -continue prescription drug management with ASA 81mg  daily and Crestor  20mg  daily with PRN refillw  2.  Hyperlipidemia -LDL goal less than 70 due to coronary calcifications -I have personally reviewed and interpreted outside labs performed by patient's PCP which showed LDL 142 and HDL 85 on 03/16/2021 -she takes  Crestor  20mg  daily and has not missed any doses -I have personally reviewed and interpreted outside labs performed by patient's PCP which showed LDL 78 and HDL 74 with ALT 26 in Jan [id number]>>dyz was eating quite a bit last month and suspect that that is why her LDL is mildly elevated -encouraged her to follow a Mediterranean diet and cut back on red meat and fat -continue to exercise -will repeat FLP and ALT in June 2024   Time Spent: 20 minutes total time of encounter, including 15 minutes spent in face-to-face patient care on the date of this encounter. This time includes coordination of care and counseling regarding above mentioned problem list. Remainder of non-face-to-face time involved reviewing chart documents/testing relevant to the patient encounter and documentation in the medical record. I have independently reviewed documentation from referring provider  Medication Adjustments/Labs and Tests Ordered: Current medicines are reviewed at length with the patient today.  Concerns regarding medicines are outlined above.  Medication changes, Labs and Tests ordered today are listed in the Patient Instructions below.  There are no Patient Instructions on file for this visit.   Signed, Erica Bihari, MD  04/03/2023 12:00 PM    [facility] [address] [place], [place], [place]  [address] Phone: [phone]; Fax: [phone]

## 2023-07-03 ENCOUNTER — Other Ambulatory Visit: Payer: Self-pay | Admitting: Cardiology

## 2023-07-03 DIAGNOSIS — R931 Abnormal findings on diagnostic imaging of heart and coronary circulation: Secondary | ICD-10-CM

## 2023-08-10 ENCOUNTER — Telehealth: Payer: Self-pay | Admitting: Podiatry

## 2023-08-10 NOTE — Telephone Encounter (Signed)
 Patient came in with her Orthotics and wanted to ask if you would be able to cut the length of her new Orthotics to match the length of her old ones. Please let me know if this is possible, will call the patient back with an update.

## 2023-08-30 ENCOUNTER — Ambulatory Visit

## 2023-08-30 NOTE — Progress Notes (Signed)
 Refurbish sent to [facility] no charge may make langer set for patient to try

## 2023-09-18 ENCOUNTER — Telehealth: Payer: Self-pay | Admitting: Podiatry

## 2023-09-18 NOTE — Telephone Encounter (Signed)
 Patient called in to find out if orthotics are here.

## 2023-09-28 ENCOUNTER — Telehealth: Payer: Self-pay

## 2023-09-28 NOTE — Telephone Encounter (Signed)
 [facility] refurb orthotics are here  Balance: $90

## 2024-05-02 ENCOUNTER — Ambulatory Visit: Admitting: Cardiology
